# Patient Record
Sex: Female | Born: 1985 | Race: White | Hispanic: No | Marital: Married | Smoking: Current every day smoker
Health system: Southern US, Community
[De-identification: ages and names within clinical notes are randomized; demographics above are authoritative.]

## PROBLEM LIST (undated history)

## (undated) DIAGNOSIS — F329 Major depressive disorder, single episode, unspecified: Secondary | ICD-10-CM

## (undated) DIAGNOSIS — F419 Anxiety disorder, unspecified: Secondary | ICD-10-CM

## (undated) DIAGNOSIS — I2699 Other pulmonary embolism without acute cor pulmonale: Secondary | ICD-10-CM

## (undated) DIAGNOSIS — N907 Vulvar cyst: Secondary | ICD-10-CM

## (undated) DIAGNOSIS — F32A Depression, unspecified: Secondary | ICD-10-CM

## (undated) DIAGNOSIS — R58 Hemorrhage, not elsewhere classified: Secondary | ICD-10-CM

## (undated) DIAGNOSIS — K219 Gastro-esophageal reflux disease without esophagitis: Secondary | ICD-10-CM

## (undated) DIAGNOSIS — E0590 Thyrotoxicosis, unspecified without thyrotoxic crisis or storm: Secondary | ICD-10-CM

## (undated) DIAGNOSIS — J019 Acute sinusitis, unspecified: Secondary | ICD-10-CM

## (undated) DIAGNOSIS — L409 Psoriasis, unspecified: Secondary | ICD-10-CM

## (undated) DIAGNOSIS — O139 Gestational [pregnancy-induced] hypertension without significant proteinuria, unspecified trimester: Secondary | ICD-10-CM

## (undated) DIAGNOSIS — M722 Plantar fascial fibromatosis: Secondary | ICD-10-CM

## (undated) DIAGNOSIS — J40 Bronchitis, not specified as acute or chronic: Secondary | ICD-10-CM

## (undated) HISTORY — DX: Acute sinusitis, unspecified: J01.90

## (undated) HISTORY — DX: Hemorrhage, not elsewhere classified: R58

## (undated) HISTORY — DX: Major depressive disorder, single episode, unspecified: F32.9

## (undated) HISTORY — PX: APPENDECTOMY: SHX54

## (undated) HISTORY — DX: Bronchitis, not specified as acute or chronic: J40

## (undated) HISTORY — DX: Plantar fascial fibromatosis: M72.2

## (undated) HISTORY — DX: Psoriasis, unspecified: L40.9

## (undated) HISTORY — DX: Anxiety disorder, unspecified: F41.9

## (undated) HISTORY — DX: Vulvar cyst: N90.7

## (undated) HISTORY — DX: Other pulmonary embolism without acute cor pulmonale: I26.99

## (undated) HISTORY — DX: Gastro-esophageal reflux disease without esophagitis: K21.9

## (undated) HISTORY — PX: WISDOM TOOTH EXTRACTION: SHX21

## (undated) HISTORY — DX: Depression, unspecified: F32.A

## (undated) HISTORY — DX: Thyrotoxicosis, unspecified without thyrotoxic crisis or storm: E05.90

---

## 2014-08-06 DIAGNOSIS — I2699 Other pulmonary embolism without acute cor pulmonale: Secondary | ICD-10-CM

## 2014-08-06 HISTORY — DX: Other pulmonary embolism without acute cor pulmonale: I26.99

## 2014-08-06 NOTE — L&D Delivery Note (Signed)
Delivery Note At 4:24 PM a viable female was delivered via  (Presentation: OA;  ).  APGAR: 8,8, baby w/ active cry but slightly dusky and taken to radiant warmer for blow-by oxygen w/o complication ; weight pending .   Placenta status: intact, .  Cord: 3 vessels  with the following complications: none  Anesthesia:  epidural Episiotomy:  none Lacerations:  nione Est. Blood Loss (mL):  334  Mom to postpartum.  Baby to Couplet care / Skin to Skin.  Reyes Ivan 03/05/2015, 4:42 PM

## 2014-08-17 ENCOUNTER — Other Ambulatory Visit: Payer: Self-pay | Admitting: Obstetrics & Gynecology

## 2014-08-17 DIAGNOSIS — O3680X0 Pregnancy with inconclusive fetal viability, not applicable or unspecified: Secondary | ICD-10-CM

## 2014-08-18 ENCOUNTER — Encounter: Payer: Self-pay | Admitting: *Deleted

## 2014-08-19 ENCOUNTER — Ambulatory Visit (INDEPENDENT_AMBULATORY_CARE_PROVIDER_SITE_OTHER): Payer: BLUE CROSS/BLUE SHIELD

## 2014-08-19 ENCOUNTER — Other Ambulatory Visit: Payer: Self-pay | Admitting: Obstetrics & Gynecology

## 2014-08-19 ENCOUNTER — Other Ambulatory Visit: Payer: BLUE CROSS/BLUE SHIELD

## 2014-08-19 DIAGNOSIS — Z3682 Encounter for antenatal screening for nuchal translucency: Secondary | ICD-10-CM

## 2014-08-19 DIAGNOSIS — Z3481 Encounter for supervision of other normal pregnancy, first trimester: Secondary | ICD-10-CM

## 2014-08-19 DIAGNOSIS — Z36 Encounter for antenatal screening of mother: Secondary | ICD-10-CM

## 2014-08-19 DIAGNOSIS — O3680X0 Pregnancy with inconclusive fetal viability, not applicable or unspecified: Secondary | ICD-10-CM

## 2014-08-19 NOTE — Progress Notes (Signed)
U/S(11+2wks)-single IUP with +FCA noted, FHR-160 bpm, CRL c/w LMP dates, posterior Gr 0 placenta, cx appears closed, bilateral adnexa appears WNL, pt desires NT/IT screening, NT-1.54mm, unable to view NB due to fetal position

## 2014-08-20 LAB — US OB COMP LESS 14 WKS

## 2014-08-25 LAB — MATERNAL SCREEN, INTEGRATED #1

## 2014-08-30 ENCOUNTER — Encounter: Payer: Self-pay | Admitting: Women's Health

## 2014-08-30 ENCOUNTER — Ambulatory Visit (INDEPENDENT_AMBULATORY_CARE_PROVIDER_SITE_OTHER): Payer: BLUE CROSS/BLUE SHIELD | Admitting: Women's Health

## 2014-08-30 VITALS — BP 132/78 | Ht 64.0 in | Wt 238.0 lb

## 2014-08-30 DIAGNOSIS — Z3490 Encounter for supervision of normal pregnancy, unspecified, unspecified trimester: Secondary | ICD-10-CM | POA: Insufficient documentation

## 2014-08-30 DIAGNOSIS — Z114 Encounter for screening for human immunodeficiency virus [HIV]: Secondary | ICD-10-CM

## 2014-08-30 DIAGNOSIS — F418 Other specified anxiety disorders: Secondary | ICD-10-CM

## 2014-08-30 DIAGNOSIS — Z1389 Encounter for screening for other disorder: Secondary | ICD-10-CM

## 2014-08-30 DIAGNOSIS — Z0184 Encounter for antibody response examination: Secondary | ICD-10-CM

## 2014-08-30 DIAGNOSIS — O09899 Supervision of other high risk pregnancies, unspecified trimester: Secondary | ICD-10-CM | POA: Insufficient documentation

## 2014-08-30 DIAGNOSIS — Z1371 Encounter for nonprocreative screening for genetic disease carrier status: Secondary | ICD-10-CM

## 2014-08-30 DIAGNOSIS — Z331 Pregnant state, incidental: Secondary | ICD-10-CM

## 2014-08-30 DIAGNOSIS — Z113 Encounter for screening for infections with a predominantly sexual mode of transmission: Secondary | ICD-10-CM

## 2014-08-30 DIAGNOSIS — Z0283 Encounter for blood-alcohol and blood-drug test: Secondary | ICD-10-CM

## 2014-08-30 DIAGNOSIS — Z3481 Encounter for supervision of other normal pregnancy, first trimester: Secondary | ICD-10-CM

## 2014-08-30 DIAGNOSIS — O09891 Supervision of other high risk pregnancies, first trimester: Secondary | ICD-10-CM

## 2014-08-30 DIAGNOSIS — Z118 Encounter for screening for other infectious and parasitic diseases: Secondary | ICD-10-CM

## 2014-08-30 DIAGNOSIS — Z1159 Encounter for screening for other viral diseases: Secondary | ICD-10-CM

## 2014-08-30 DIAGNOSIS — Z3491 Encounter for supervision of normal pregnancy, unspecified, first trimester: Secondary | ICD-10-CM

## 2014-08-30 LAB — POCT URINALYSIS DIPSTICK
Blood, UA: NEGATIVE
Glucose, UA: NEGATIVE
Ketones, UA: NEGATIVE
Leukocytes, UA: NEGATIVE
Nitrite, UA: NEGATIVE
PROTEIN UA: NEGATIVE

## 2014-08-30 LAB — RPR

## 2014-08-30 LAB — HIV ANTIBODY (ROUTINE TESTING W REFLEX): HIV 1&2 Ab, 4th Generation: NONREACTIVE

## 2014-08-30 LAB — CBC
HCT: 40.3 % (ref 36.0–46.0)
Hemoglobin: 13.7 g/dL (ref 12.0–15.0)
MCH: 28.8 pg (ref 26.0–34.0)
MCHC: 34 g/dL (ref 30.0–36.0)
MCV: 84.7 fL (ref 78.0–100.0)
MPV: 9.2 fL (ref 8.6–12.4)
Platelets: 254 10*3/uL (ref 150–400)
RBC: 4.76 MIL/uL (ref 3.87–5.11)
RDW: 13.4 % (ref 11.5–15.5)
WBC: 6.1 10*3/uL (ref 4.0–10.5)

## 2014-08-30 LAB — HEPATITIS B SURFACE ANTIGEN: HEP B S AG: NEGATIVE

## 2014-08-30 MED ORDER — DOXYLAMINE-PYRIDOXINE 10-10 MG PO TBEC: DELAYED_RELEASE_TABLET | ORAL | Status: DC

## 2014-08-30 MED ORDER — CONCEPT DHA 53.5-38-1 MG PO CAPS: 1.0000 | ORAL_CAPSULE | Freq: Every day | ORAL | Status: DC

## 2014-08-30 MED ORDER — CITALOPRAM HYDROBROMIDE 40 MG PO TABS: 40.0000 mg | ORAL_TABLET | Freq: Every day | ORAL | Status: DC

## 2014-08-30 NOTE — Progress Notes (Addendum)
Subjective:  Cheryl Bowman is a 29 y.o. G69P2002 Caucasian female at [redacted]w[redacted]d by LMP c/w 11wk u/s, being seen today for her first obstetrical visit.  Her obstetrical history is significant for term SVB x 2, last birth 12/06/13- did have labile bp's but no dx of [facility] both other children at [facility]. H/O depression/anxiety dx at 29yo, has been on multiple meds, latest paxil, but was taken off in Dec by [facility], sees them q 6 months for meds, has never had counseling. Celexa 40mg  worked very well for her last pregnancy. Pregnancy history fully reviewed.  Patient reports n/v, was rx'd diclegis by PCP, has only been taking 2 q hs- states isn't helping- instructed that she can go up to 4/day- if still not helping to let us know. Denies vb, cramping, uti s/s, abnormal/malodorous vag d/c, or vulvovaginal itching/irritation.  BP 144/86 mmHg  Wt 238 lb (107.956 kg)  LMP 06/01/2014  BP recheck 132/78, no h/o HTN other than labile bp's at end of last pregnancy  HISTORY: OB History  Gravida Para Term Preterm AB SAB TAB Ectopic Multiple Living  3 2 2       2     # Outcome Date GA Lbr Len/2nd Weight Sex Delivery Anes PTL Lv  3 Current           2 Term 12/06/13 [redacted]w[redacted]d  6 lb 4 oz (2.835 kg) M Vag-Spont EPI N Y  1 Term 05/24/06 [redacted]w[redacted]d  8 lb 4 oz (3.742 kg) F Vag-Spont EPI N Y     Past Medical History  Diagnosis Date  . Depression   . Anxiety   . Bronchitis   . Plantar fasciitis   . Sinusitis, acute    Past Surgical History  Procedure Laterality Date  . Appendectomy     Family History  Problem Relation Age of Onset  . Alcohol abuse Mother   . Hypertension Father   . Depression Father   . Alcohol abuse Father   . Hyperlipidemia Father   . Stroke Father   . Cancer Maternal Grandmother     lung  . Depression Paternal Grandmother   . Kidney disease Paternal Grandfather   . Heart disease Paternal Grandfather     Exam   System:     General: Well developed & nourished, no acute distress   Skin:  Warm & dry, normal coloration and turgor, no rashes   Neurologic: Alert & oriented, normal mood   Cardiovascular: Regular rate & rhythm   Respiratory: Effort & rate normal, LCTAB, acyanotic   Abdomen: Soft, non tender   Extremities: normal strength, tone   Thin prep pap smear neg 2015 [place] per pt  FHR: 160s via informal u/s, was unable to hear w/ doppler   Assessment:   Pregnancy: F7P1025 Patient Active Problem List   Diagnosis Date Noted  . Supervision of normal pregnancy 08/30/2014    Priority: High    [redacted]w[redacted]d G3P2002 New OB visit N/V of pregnancy Depression/anxiety Short interval pregnancy  Plan:  Initial labs drawn Continue prenatal vitamins Problem list reviewed and updated Reviewed n/v relief measures and warning s/s to report Rx diclegis, 2 samples given, coupon card given Rx celexa 20mg  x 1wk then increase to 40mg  daily Referral to [facility] faxed to begin counseling Reviewed recommended weight gain based on pre-gravid BMI Encouraged well-balanced diet Genetic Screening discussed Integrated Screen: 1st it/nt already done last week- was unable to see nasal bone d/t position- discussed w/ Cheryl Bowman- does not need to be repeated  Cystic fibrosis screening discussed requested Ultrasound discussed; fetal survey: requested Follow up in 3 weeks for 2nd IT and visit [place] completed Get pap records from [place], [facility] 08/30/2014 11:13 AM

## 2014-08-30 NOTE — Patient Instructions (Signed)
Nausea & Vomiting  Have saltine crackers or pretzels by your bed and eat a few bites before you raise your head out of bed in the morning  Eat small frequent meals throughout the day instead of large meals  Drink plenty of fluids throughout the day to stay hydrated, just don't drink a lot of fluids with your meals.  This can make your stomach fill up faster making you feel sick  Do not brush your teeth right after you eat  Products with real ginger are good for nausea, like ginger ale and ginger hard candy Make sure it says made with real ginger!  Sucking on sour candy like lemon heads is also good for nausea  If your prenatal vitamins make you nauseated, take them at night so you will sleep through the nausea  Sea Bands  If you feel like you need medicine for the nausea & vomiting please let us know  If you are unable to keep any fluids or food down please let us know   First Trimester of Pregnancy The first trimester of pregnancy is from week 1 until the end of week 12 (months 1 through 3). A week after a sperm fertilizes an egg, the egg will implant on the wall of the uterus. This embryo will begin to develop into a baby. Genes from you and your partner are forming the baby. The female genes determine whether the baby is a boy or a girl. At 6-8 weeks, the eyes and face are formed, and the heartbeat can be seen on ultrasound. At the end of 12 weeks, all the baby's organs are formed.  Now that you are pregnant, you will want to do everything you can to have a healthy baby. Two of the most important things are to get good prenatal care and to follow your health care provider's instructions. Prenatal care is all the medical care you receive before the baby's birth. This care will help prevent, find, and treat any problems during the pregnancy and childbirth. BODY CHANGES Your body goes through many changes during pregnancy. The changes vary from woman to woman.   You may gain or lose a  couple of pounds at first.  You may feel sick to your stomach (nauseous) and throw up (vomit). If the vomiting is uncontrollable, call your health care provider.  You may tire easily.  You may develop headaches that can be relieved by medicines approved by your health care provider.  You may urinate more often. Painful urination may mean you have a bladder infection.  You may develop heartburn as a result of your pregnancy.  You may develop constipation because certain hormones are causing the muscles that push waste through your intestines to slow down.  You may develop hemorrhoids or swollen, bulging veins (varicose veins).  Your breasts may begin to grow larger and become tender. Your nipples may stick out more, and the tissue that surrounds them (areola) may become darker.  Your gums may bleed and may be sensitive to brushing and flossing.  Dark spots or blotches (chloasma, mask of pregnancy) may develop on your face. This will likely fade after the baby is born.  Your menstrual periods will stop.  You may have a loss of appetite.  You may develop cravings for certain kinds of food.  You may have changes in your emotions from day to day, such as being excited to be pregnant or being concerned that something may go wrong with the pregnancy and baby.  You may have more vivid and strange dreams.  You may have changes in your hair. These can include thickening of your hair, rapid growth, and changes in texture. Some women also have hair loss during or after pregnancy, or hair that feels dry or thin. Your hair will most likely return to normal after your baby is born. WHAT TO EXPECT AT YOUR PRENATAL VISITS During a routine prenatal visit:  You will be weighed to make sure you and the baby are growing normally.  Your blood pressure will be taken.  Your abdomen will be measured to track your baby's growth.  The fetal heartbeat will be listened to starting around week 10 or 12  of your pregnancy.  Test results from any previous visits will be discussed. Your health care provider may ask you:  How you are feeling.  If you are feeling the baby move.  If you have had any abnormal symptoms, such as leaking fluid, bleeding, severe headaches, or abdominal cramping.  If you have any questions. Other tests that may be performed during your first trimester include:  Blood tests to find your blood type and to check for the presence of any previous infections. They will also be used to check for low iron levels (anemia) and Rh antibodies. Later in the pregnancy, blood tests for diabetes will be done along with other tests if problems develop.  Urine tests to check for infections, diabetes, or protein in the urine.  An ultrasound to confirm the proper growth and development of the baby.  An amniocentesis to check for possible genetic problems.  Fetal screens for spina bifida and Down syndrome.  You may need other tests to make sure you and the baby are doing well. HOME CARE INSTRUCTIONS  Medicines  Follow your health care provider's instructions regarding medicine use. Specific medicines may be either safe or unsafe to take during pregnancy.  Take your prenatal vitamins as directed.  If you develop constipation, try taking a stool softener if your health care provider approves. Diet  Eat regular, well-balanced meals. Choose a variety of foods, such as meat or vegetable-based protein, fish, milk and low-fat dairy products, vegetables, fruits, and whole grain breads and cereals. Your health care provider will help you determine the amount of weight gain that is right for you.  Avoid raw meat and uncooked cheese. These carry germs that can cause birth defects in the baby.  Eating four or five small meals rather than three large meals a day may help relieve nausea and vomiting. If you start to feel nauseous, eating a few soda crackers can be helpful. Drinking liquids  between meals instead of during meals also seems to help nausea and vomiting.  If you develop constipation, eat more high-fiber foods, such as fresh vegetables or fruit and whole grains. Drink enough fluids to keep your urine clear or pale yellow. Activity and Exercise  Exercise only as directed by your health care provider. Exercising will help you:  Control your weight.  Stay in shape.  Be prepared for labor and delivery.  Experiencing pain or cramping in the lower abdomen or low back is a good sign that you should stop exercising. Check with your health care provider before continuing normal exercises.  Try to avoid standing for long periods of time. Move your legs often if you must stand in one place for a long time.  Avoid heavy lifting.  Wear low-heeled shoes, and practice good posture.  You may continue to have  sex unless your health care provider directs you otherwise. Relief of Pain or Discomfort  Wear a good support bra for breast tenderness.   Take warm sitz baths to soothe any pain or discomfort caused by hemorrhoids. Use hemorrhoid cream if your health care provider approves.   Rest with your legs elevated if you have leg cramps or low back pain.  If you develop varicose veins in your legs, wear support hose. Elevate your feet for 15 minutes, 3-4 times a day. Limit salt in your diet. Prenatal Care  Schedule your prenatal visits by the twelfth week of pregnancy. They are usually scheduled monthly at first, then more often in the last 2 months before delivery.  Write down your questions. Take them to your prenatal visits.  Keep all your prenatal visits as directed by your health care provider. Safety  Wear your seat belt at all times when driving.  Make a list of emergency phone numbers, including numbers for family, friends, the hospital, and police and fire departments. General Tips  Ask your health care provider for a referral to a local prenatal education  class. Begin classes no later than at the beginning of month 6 of your pregnancy.  Ask for help if you have counseling or nutritional needs during pregnancy. Your health care provider can offer advice or refer you to specialists for help with various needs.  Do not use hot tubs, steam rooms, or saunas.  Do not douche or use tampons or scented sanitary pads.  Do not cross your legs for long periods of time.  Avoid cat litter boxes and soil used by cats. These carry germs that can cause birth defects in the baby and possibly loss of the fetus by miscarriage or stillbirth.  Avoid all smoking, herbs, alcohol, and medicines not prescribed by your health care provider. Chemicals in these affect the formation and growth of the baby.  Schedule a dentist appointment. At home, brush your teeth with a soft toothbrush and be gentle when you floss. SEEK MEDICAL CARE IF:   You have dizziness.  You have mild pelvic cramps, pelvic pressure, or nagging pain in the abdominal area.  You have persistent nausea, vomiting, or diarrhea.  You have a bad smelling vaginal discharge.  You have pain with urination.  You notice increased swelling in your face, hands, legs, or ankles. SEEK IMMEDIATE MEDICAL CARE IF:   You have a fever.  You are leaking fluid from your vagina.  You have spotting or bleeding from your vagina.  You have severe abdominal cramping or pain.  You have rapid weight gain or loss.  You vomit blood or material that looks like coffee grounds.  You are exposed to [place] measles and have never had them.  You are exposed to fifth disease or chickenpox.  You develop a severe headache.  You have shortness of breath.  You have any kind of trauma, such as from a fall or a car accident. Document Released: 07/17/2001 Document Revised: 12/07/2013 Document Reviewed: 06/02/2013 [facility] Patient Information 2015 [place], [place]. This information is not intended to replace advice given  to you by your health care provider. Make sure you discuss any questions you have with your health care provider.

## 2014-08-31 ENCOUNTER — Encounter: Payer: Self-pay | Admitting: Women's Health

## 2014-08-31 DIAGNOSIS — Z6791 Unspecified blood type, Rh negative: Secondary | ICD-10-CM | POA: Insufficient documentation

## 2014-08-31 DIAGNOSIS — O26899 Other specified pregnancy related conditions, unspecified trimester: Secondary | ICD-10-CM | POA: Insufficient documentation

## 2014-08-31 LAB — DRUG SCREEN, URINE, NO CONFIRMATION
Amphetamine Screen, Ur: NEGATIVE
BENZODIAZEPINES.: NEGATIVE
Barbiturate Quant, Ur: NEGATIVE
CREATININE, U: 93.7 mg/dL
Cocaine Metabolites: NEGATIVE
METHADONE: NEGATIVE
Marijuana Metabolite: NEGATIVE
Opiate Screen, Urine: NEGATIVE
Phencyclidine (PCP): NEGATIVE
Propoxyphene: NEGATIVE

## 2014-08-31 LAB — GC/CHLAMYDIA PROBE AMP
CT Probe RNA: NEGATIVE
GC PROBE AMP APTIMA: NEGATIVE

## 2014-08-31 LAB — OXYCODONE SCREEN, UA, RFLX CONFIRM: Oxycodone Screen, Ur: NEGATIVE ng/mL

## 2014-08-31 LAB — URINALYSIS, ROUTINE W REFLEX MICROSCOPIC
BILIRUBIN URINE: NEGATIVE
Glucose, UA: NEGATIVE mg/dL
HGB URINE DIPSTICK: NEGATIVE
Ketones, ur: NEGATIVE mg/dL
Leukocytes, UA: NEGATIVE
NITRITE: NEGATIVE
PH: 7 (ref 5.0–8.0)
PROTEIN: NEGATIVE mg/dL
Specific Gravity, Urine: 1.018 (ref 1.005–1.030)
Urobilinogen, UA: 0.2 mg/dL (ref 0.0–1.0)

## 2014-08-31 LAB — RUBELLA SCREEN: Rubella: 3.45 Index — ABNORMAL HIGH (ref <0.90)

## 2014-08-31 LAB — ANTIBODY SCREEN: Antibody Screen: NEGATIVE

## 2014-08-31 LAB — URINE CULTURE
COLONY COUNT: NO GROWTH
Organism ID, Bacteria: NO GROWTH

## 2014-08-31 LAB — ABO AND RH: RH TYPE: NEGATIVE

## 2014-08-31 LAB — VARICELLA ZOSTER ANTIBODY, IGG: Varicella IgG: 3621 Index — ABNORMAL HIGH (ref <135.00)

## 2014-09-01 LAB — CYSTIC FIBROSIS DIAGNOSTIC STUDY

## 2014-09-20 ENCOUNTER — Encounter: Payer: BLUE CROSS/BLUE SHIELD | Admitting: Women's Health

## 2014-09-23 ENCOUNTER — Ambulatory Visit (INDEPENDENT_AMBULATORY_CARE_PROVIDER_SITE_OTHER): Payer: BLUE CROSS/BLUE SHIELD | Admitting: Adult Health

## 2014-09-23 ENCOUNTER — Encounter: Payer: Self-pay | Admitting: Adult Health

## 2014-09-23 VITALS — BP 130/70 | Wt 237.5 lb

## 2014-09-23 DIAGNOSIS — Z3481 Encounter for supervision of other normal pregnancy, first trimester: Secondary | ICD-10-CM | POA: Diagnosis not present

## 2014-09-23 DIAGNOSIS — Z331 Pregnant state, incidental: Secondary | ICD-10-CM

## 2014-09-23 DIAGNOSIS — Z3492 Encounter for supervision of normal pregnancy, unspecified, second trimester: Secondary | ICD-10-CM

## 2014-09-23 DIAGNOSIS — Z369 Encounter for antenatal screening, unspecified: Secondary | ICD-10-CM

## 2014-09-23 DIAGNOSIS — Z3482 Encounter for supervision of other normal pregnancy, second trimester: Secondary | ICD-10-CM

## 2014-09-23 DIAGNOSIS — Z1389 Encounter for screening for other disorder: Secondary | ICD-10-CM

## 2014-09-23 LAB — POCT URINALYSIS DIPSTICK
Glucose, UA: NEGATIVE
Ketones, UA: NEGATIVE
NITRITE UA: NEGATIVE
Protein, UA: NEGATIVE

## 2014-09-23 NOTE — Progress Notes (Signed)
[id number] [redacted]w[redacted]d Estimated Date of Delivery: 03/08/15  Blood pressure 130/70, weight 237 lb 8 oz (107.729 kg), last menstrual period 06/01/2014, not currently breastfeeding.   BP weight and urine results all reviewed and noted. Trace blood and 1+ WBC in urine Please refer to the obstetrical flow sheet for the fundal height and fetal heart rate documentation:FHR 143 by doppler  Patient denies any bleeding and no rupture of membranes symptoms or regular contractions. Has headache today taking tylenol, try caffeine drink, and Claritin, no vision changes.If not better in am call. All questions were answered. For second IT today.  Plan:  Continued routine obstetrical care,   Follow up in 4 weeks for OB appointment, and anatomy scan

## 2014-09-23 NOTE — Patient Instructions (Signed)
Second Trimester of Pregnancy The second trimester is from week 13 through week 28, months 4 through 6. The second trimester is often a time when you feel your best. Your body has also adjusted to being pregnant, and you begin to feel better physically. Usually, morning sickness has lessened or quit completely, you may have more energy, and you may have an increase in appetite. The second trimester is also a time when the fetus is growing rapidly. At the end of the sixth month, the fetus is about 9 inches long and weighs about 1 pounds. You will likely begin to feel the baby move (quickening) between 18 and 20 weeks of the pregnancy. BODY CHANGES Your body goes through many changes during pregnancy. The changes vary from woman to woman.   Your weight will continue to increase. You will notice your lower abdomen bulging out.  You may begin to get stretch marks on your hips, abdomen, and breasts.  You may develop headaches that can be relieved by medicines approved by your health care provider.  You may urinate more often because the fetus is pressing on your bladder.  You may develop or continue to have heartburn as a result of your pregnancy.  You may develop constipation because certain hormones are causing the muscles that push waste through your intestines to slow down.  You may develop hemorrhoids or swollen, bulging veins (varicose veins).  You may have back pain because of the weight gain and pregnancy hormones relaxing your joints between the bones in your pelvis and as a result of a shift in weight and the muscles that support your balance.  Your breasts will continue to grow and be tender.  Your gums may bleed and may be sensitive to brushing and flossing.  Dark spots or blotches (chloasma, mask of pregnancy) may develop on your face. This will likely fade after the baby is born.  A dark line from your belly button to the pubic area (linea nigra) may appear. This will likely fade  after the baby is born.  You may have changes in your hair. These can include thickening of your hair, rapid growth, and changes in texture. Some women also have hair loss during or after pregnancy, or hair that feels dry or thin. Your hair will most likely return to normal after your baby is born. WHAT TO EXPECT AT YOUR PRENATAL VISITS During a routine prenatal visit:  You will be weighed to make sure you and the fetus are growing normally.  Your blood pressure will be taken.  Your abdomen will be measured to track your baby's growth.  The fetal heartbeat will be listened to.  Any test results from the previous visit will be discussed. Your health care provider may ask you:  How you are feeling.  If you are feeling the baby move.  If you have had any abnormal symptoms, such as leaking fluid, bleeding, severe headaches, or abdominal cramping.  If you have any questions. Other tests that may be performed during your second trimester include:  Blood tests that check for:  Low iron levels (anemia).  Gestational diabetes (between 24 and 28 weeks).  Rh antibodies.  Urine tests to check for infections, diabetes, or protein in the urine.  An ultrasound to confirm the proper growth and development of the baby.  An amniocentesis to check for possible genetic problems.  Fetal screens for spina bifida and Down syndrome. HOME CARE INSTRUCTIONS   Avoid all smoking, herbs, alcohol, and unprescribed   drugs. These chemicals affect the formation and growth of the baby.  Follow your health care provider's instructions regarding medicine use. There are medicines that are either safe or unsafe to take during pregnancy.  Exercise only as directed by your health care provider. Experiencing uterine cramps is a good sign to stop exercising.  Continue to eat regular, healthy meals.  Wear a good support bra for breast tenderness.  Do not use hot tubs, steam rooms, or saunas.  Wear your  seat belt at all times when driving.  Avoid raw meat, uncooked cheese, cat litter boxes, and soil used by cats. These carry germs that can cause birth defects in the baby.  Take your prenatal vitamins.  Try taking a stool softener (if your health care provider approves) if you develop constipation. Eat more high-fiber foods, such as fresh vegetables or fruit and whole grains. Drink plenty of fluids to keep your urine clear or pale yellow.  Take warm sitz baths to soothe any pain or discomfort caused by hemorrhoids. Use hemorrhoid cream if your health care provider approves.  If you develop varicose veins, wear support hose. Elevate your feet for 15 minutes, 3-4 times a day. Limit salt in your diet.  Avoid heavy lifting, wear low heel shoes, and practice good posture.  Rest with your legs elevated if you have leg cramps or low back pain.  Visit your dentist if you have not gone yet during your pregnancy. Use a soft toothbrush to brush your teeth and be gentle when you floss.  A sexual relationship may be continued unless your health care provider directs you otherwise.  Continue to go to all your prenatal visits as directed by your health care provider. SEEK MEDICAL CARE IF:   You have dizziness.  You have mild pelvic cramps, pelvic pressure, or nagging pain in the abdominal area.  You have persistent nausea, vomiting, or diarrhea.  You have a bad smelling vaginal discharge.  You have pain with urination. SEEK IMMEDIATE MEDICAL CARE IF:   You have a fever.  You are leaking fluid from your vagina.  You have spotting or bleeding from your vagina.  You have severe abdominal cramping or pain.  You have rapid weight gain or loss.  You have shortness of breath with chest pain.  You notice sudden or extreme swelling of your face, hands, ankles, feet, or legs.  You have not felt your baby move in over an hour.  You have severe headaches that do not go away with  medicine.  You have vision changes. Document Released: 07/17/2001 Document Revised: 07/28/2013 Document Reviewed: 09/23/2012 [facility] Patient Information 2015 [place], [place]. This information is not intended to replace advice given to you by your health care provider. Make sure you discuss any questions you have with your health care provider. Return in 4 weeks for [place]

## 2014-09-28 LAB — MATERNAL SCREEN, INTEGRATED #2
AFP MoM: 0.85
AFP, Serum: 22.9 ng/mL
CROWN RUMP LENGTH MAT SCREEN 2: 53.4 mm
Calculated Gestational Age: 16.7
ESTRIOL FREE MAT SCREEN: 0.7 ng/mL
ESTRIOL MOM MAT SCREEN: 0.83
HCG, SERUM MAT SCREEN: 40 [IU]/mL
Inhibin A Dimeric: 185 pg/mL
Inhibin A MoM: 1.37
MSS Down Syndrome: <1:5000 {titer}
MSS Trisomy 18 Risk: <1:5000 {titer}
NT MoM: 1.09
NUCHAL TRANSLUCENCY MAT SCREEN 2: 1.4 mm
Number of fetuses: 1
PAPP-A MOM MAT SCREEN: 1.41
PAPP-A: 423 ng/mL
hCG MoM: 1.7

## 2014-10-21 ENCOUNTER — Other Ambulatory Visit: Payer: Self-pay | Admitting: Adult Health

## 2014-10-21 ENCOUNTER — Ambulatory Visit (INDEPENDENT_AMBULATORY_CARE_PROVIDER_SITE_OTHER): Payer: BLUE CROSS/BLUE SHIELD | Admitting: Advanced Practice Midwife

## 2014-10-21 ENCOUNTER — Ambulatory Visit (INDEPENDENT_AMBULATORY_CARE_PROVIDER_SITE_OTHER): Payer: BLUE CROSS/BLUE SHIELD

## 2014-10-21 VITALS — BP 120/80 | HR 84 | Wt 236.0 lb

## 2014-10-21 DIAGNOSIS — Z3492 Encounter for supervision of normal pregnancy, unspecified, second trimester: Secondary | ICD-10-CM

## 2014-10-21 DIAGNOSIS — Z331 Pregnant state, incidental: Secondary | ICD-10-CM

## 2014-10-21 DIAGNOSIS — N898 Other specified noninflammatory disorders of vagina: Secondary | ICD-10-CM | POA: Diagnosis not present

## 2014-10-21 DIAGNOSIS — Z1389 Encounter for screening for other disorder: Secondary | ICD-10-CM

## 2014-10-21 DIAGNOSIS — Z3482 Encounter for supervision of other normal pregnancy, second trimester: Secondary | ICD-10-CM | POA: Diagnosis not present

## 2014-10-21 LAB — POCT URINALYSIS DIPSTICK
Blood, UA: NEGATIVE
Glucose, UA: NEGATIVE
Ketones, UA: NEGATIVE
LEUKOCYTES UA: NEGATIVE
Nitrite, UA: NEGATIVE
PROTEIN UA: NEGATIVE

## 2014-10-21 MED ORDER — TERCONAZOLE 0.4 % VA CREA: TOPICAL_CREAM | VAGINAL | Status: DC

## 2014-10-21 NOTE — Progress Notes (Signed)
[id number] [redacted]w[redacted]d Estimated Date of Delivery: 03/08/15  Last menstrual period 06/01/2014, not currently breastfeeding.   BP weight and urine results all reviewed and noted.  Please refer to the obstetrical flow sheet for the fundal height and fetal heart rate documentation: Had anatomy scan today:  [place] 20WKS2D,SINGLE FETUS,BREECH,MEASUREMENTS C/W DATES, FLUID WNL,POST PL GRADE 0,CX APPEARS TO BE CLOSED ,BILAT ADNEX WNL,FHR 152BPM,NO MAJOR ABN NOTED,ANATOMY SCREEN COMPLETED  Patient reports good fetal movement, denies any bleeding and no rupture of membranes symptoms or regular contractions. Patient c/o external vaginal itching without discharge for 3 months (has not mentioned it to anyone).Vulva red with yeast rash.  SSE:  Normal appearing dc, wet prep neg All questions were answered.  Plan:  Continued routine obstetrical care, monistat to vulva  Follow up in 4 weeks for OB appointment,

## 2014-10-21 NOTE — Progress Notes (Signed)
[place] 20WKS2D,SINGLE FETUS,BREECH,MEASUREMENTS C/W DATES, FLUID WNL,POST PL GRADE 0,CX APPEARS TO BE CLOSED ,BILAT ADNEXA  WNL,FHR 152BPM,NO MAJOR ABN NOTED,ANATOMY SCREEN COMPLETED

## 2014-11-18 ENCOUNTER — Ambulatory Visit (INDEPENDENT_AMBULATORY_CARE_PROVIDER_SITE_OTHER): Payer: BLUE CROSS/BLUE SHIELD | Admitting: Obstetrics & Gynecology

## 2014-11-18 VITALS — BP 120/80 | HR 80 | Wt 237.0 lb

## 2014-11-18 DIAGNOSIS — Z331 Pregnant state, incidental: Secondary | ICD-10-CM

## 2014-11-18 DIAGNOSIS — Z1389 Encounter for screening for other disorder: Secondary | ICD-10-CM

## 2014-11-18 DIAGNOSIS — Z3492 Encounter for supervision of normal pregnancy, unspecified, second trimester: Secondary | ICD-10-CM

## 2014-11-18 LAB — POCT URINALYSIS DIPSTICK
Blood, UA: NEGATIVE
Glucose, UA: NEGATIVE
Ketones, UA: NEGATIVE
LEUKOCYTES UA: NEGATIVE
NITRITE UA: NEGATIVE

## 2014-11-18 MED ORDER — LANSOPRAZOLE 15 MG PO TBDP: 15.0000 mg | ORAL_TABLET | Freq: Every day | ORAL | Status: DC

## 2014-11-18 NOTE — Progress Notes (Signed)
Pt denies any problems or concerns at this time.  

## 2014-11-18 NOTE — Progress Notes (Signed)
[id number] [redacted]w[redacted]d Estimated Date of Delivery: 03/08/15  Blood pressure 120/80, pulse 80, weight 237 lb (107.502 kg), last menstrual period 06/01/2014, not currently breastfeeding.   BP weight and urine results all reviewed and noted.  Please refer to the obstetrical flow sheet for the fundal height and fetal heart rate documentation:  Patient reports good fetal movement, denies any bleeding and no rupture of membranes symptoms or regular contractions. Patient is without complaints. All questions were answered.  Plan:  Continued routine obstetrical care, **  Follow up in 4 weeks for OB appointment, PN2

## 2014-12-16 ENCOUNTER — Encounter: Payer: Self-pay | Admitting: Advanced Practice Midwife

## 2014-12-16 ENCOUNTER — Other Ambulatory Visit: Payer: BLUE CROSS/BLUE SHIELD

## 2014-12-16 ENCOUNTER — Ambulatory Visit (INDEPENDENT_AMBULATORY_CARE_PROVIDER_SITE_OTHER): Payer: BLUE CROSS/BLUE SHIELD | Admitting: Advanced Practice Midwife

## 2014-12-16 VITALS — BP 116/78 | HR 68 | Wt 241.0 lb

## 2014-12-16 DIAGNOSIS — Z369 Encounter for antenatal screening, unspecified: Secondary | ICD-10-CM

## 2014-12-16 DIAGNOSIS — Z3493 Encounter for supervision of normal pregnancy, unspecified, third trimester: Secondary | ICD-10-CM

## 2014-12-16 DIAGNOSIS — F418 Other specified anxiety disorders: Secondary | ICD-10-CM

## 2014-12-16 DIAGNOSIS — Z1389 Encounter for screening for other disorder: Secondary | ICD-10-CM

## 2014-12-16 DIAGNOSIS — Z131 Encounter for screening for diabetes mellitus: Secondary | ICD-10-CM

## 2014-12-16 DIAGNOSIS — Z331 Pregnant state, incidental: Secondary | ICD-10-CM

## 2014-12-16 LAB — POCT URINALYSIS DIPSTICK
Blood, UA: NEGATIVE
Glucose, UA: NEGATIVE
KETONES UA: NEGATIVE
Leukocytes, UA: NEGATIVE
Nitrite, UA: NEGATIVE

## 2014-12-16 NOTE — Progress Notes (Signed)
[id number] [redacted]w[redacted]d Estimated Date of Delivery: 03/08/15  Blood pressure 116/78, pulse 68, weight 241 lb (109.317 kg), last menstrual period 06/01/2014, not currently breastfeeding.   BP weight and urine results all reviewed and noted.  Please refer to the obstetrical flow sheet for the fundal height and fetal heart rate documentation:  Patient reports good fetal movement, denies any bleeding and no rupture of membranes symptoms or regular contractions. Patient states that her celexa never worked and wants to change meds.  However, she states that zoloft, lexapro, prozac "never worked." "no meds have ever worked".   States that main complaint is "being irritable all the time".  Denies SI/HI.  Adamantly refuses any type of counseling.  Pt got irritated with me when I expressed that therapy is a valuable tool for dealing with psychiatric issues.  She stated that "how's therapy going to help when it's a chemical imbalance" and "y'all ([facility] providers) are the only ones who have ever even recommended therapy.  I ust want a pill to make it better."   All questions were answered.  Plan:  Continued routine obstetrical care, PN2 today.  Continue celexa:  Pt acknowledges that she has never responded to any of the meds that are safer for pregnancy.  I still encouraged counseling  Follow up in 3 weeks for OB appointment,

## 2014-12-17 LAB — CBC
HEMATOCRIT: 35.7 % (ref 34.0–46.6)
HEMOGLOBIN: 11.7 g/dL (ref 11.1–15.9)
MCH: 28.8 pg (ref 26.6–33.0)
MCHC: 32.8 g/dL (ref 31.5–35.7)
MCV: 88 fL (ref 79–97)
PLATELETS: 231 10*3/uL (ref 150–379)
RBC: 4.06 x10E6/uL (ref 3.77–5.28)
RDW: 14.2 % (ref 12.3–15.4)
WBC: 6.7 10*3/uL (ref 3.4–10.8)

## 2014-12-17 LAB — GLUCOSE TOLERANCE, 2 HOURS W/ 1HR
GLUCOSE, FASTING: 84 mg/dL (ref 65–91)
Glucose, 1 hour: 164 mg/dL (ref 65–179)
Glucose, 2 hour: 122 mg/dL (ref 65–152)

## 2014-12-17 LAB — RPR: RPR Ser Ql: NONREACTIVE

## 2014-12-17 LAB — HIV ANTIBODY (ROUTINE TESTING W REFLEX): HIV Screen 4th Generation wRfx: NONREACTIVE

## 2014-12-17 LAB — ANTIBODY SCREEN: Antibody Screen: NEGATIVE

## 2014-12-17 LAB — HSV 2 ANTIBODY, IGG

## 2015-01-05 ENCOUNTER — Ambulatory Visit (INDEPENDENT_AMBULATORY_CARE_PROVIDER_SITE_OTHER): Payer: BLUE CROSS/BLUE SHIELD | Admitting: Women's Health

## 2015-01-05 ENCOUNTER — Encounter: Payer: Self-pay | Admitting: Women's Health

## 2015-01-05 VITALS — BP 136/84 | HR 92 | Wt 240.0 lb

## 2015-01-05 DIAGNOSIS — O360131 Maternal care for anti-D [Rh] antibodies, third trimester, fetus 1: Secondary | ICD-10-CM

## 2015-01-05 DIAGNOSIS — Z331 Pregnant state, incidental: Secondary | ICD-10-CM

## 2015-01-05 DIAGNOSIS — Z3493 Encounter for supervision of normal pregnancy, unspecified, third trimester: Secondary | ICD-10-CM

## 2015-01-05 DIAGNOSIS — Z6791 Unspecified blood type, Rh negative: Secondary | ICD-10-CM

## 2015-01-05 DIAGNOSIS — Z1389 Encounter for screening for other disorder: Secondary | ICD-10-CM

## 2015-01-05 LAB — POCT URINALYSIS DIPSTICK
Blood, UA: NEGATIVE
Glucose, UA: NEGATIVE
Ketones, UA: NEGATIVE
Nitrite, UA: NEGATIVE
Protein, UA: NEGATIVE

## 2015-01-05 MED ORDER — RHO D IMMUNE GLOBULIN 1500 UNIT/2ML IJ SOSY
300.0000 ug | PREFILLED_SYRINGE | Freq: Once | INTRAMUSCULAR | Status: AC
  Administered 2015-01-05: 300 ug via INTRAMUSCULAR

## 2015-01-05 NOTE — Patient Instructions (Signed)

## 2015-01-05 NOTE — Progress Notes (Signed)
Low-risk OB appointment [id number] [redacted]w[redacted]d Estimated Date of Delivery: 03/08/15 BP 136/84 mmHg  Pulse 92  Wt 240 lb (108.863 kg)  LMP 06/01/2014  BP, weight, and urine reviewed.  Refer to obstetrical flow sheet for FH & FHR.  Reports good fm.  Denies regular uc's, lof, vb, or uti s/s. No complaints. Reviewed ptl s/s, fkc, normal pn2 results. Plan:  Continue routine obstetrical care  F/U in 2wks for OB appointment  Rhogam today

## 2015-01-20 ENCOUNTER — Ambulatory Visit (INDEPENDENT_AMBULATORY_CARE_PROVIDER_SITE_OTHER): Payer: BLUE CROSS/BLUE SHIELD | Admitting: Advanced Practice Midwife

## 2015-01-20 VITALS — BP 122/70 | HR 97 | Wt 242.0 lb

## 2015-01-20 DIAGNOSIS — Z3493 Encounter for supervision of normal pregnancy, unspecified, third trimester: Secondary | ICD-10-CM

## 2015-01-20 DIAGNOSIS — Z1389 Encounter for screening for other disorder: Secondary | ICD-10-CM

## 2015-01-20 DIAGNOSIS — Z331 Pregnant state, incidental: Secondary | ICD-10-CM

## 2015-01-20 LAB — POCT URINALYSIS DIPSTICK
Glucose, UA: NEGATIVE
Ketones, UA: NEGATIVE
LEUKOCYTES UA: NEGATIVE
Nitrite, UA: NEGATIVE
RBC UA: NEGATIVE

## 2015-01-20 NOTE — Progress Notes (Signed)
[id number] [redacted]w[redacted]d Estimated Date of Delivery: 03/08/15  Last menstrual period 06/01/2014, not currently breastfeeding.   BP weight and urine results all reviewed and noted.  Please refer to the obstetrical flow sheet for the fundal height and fetal heart rate documentation:  Patient reports good fetal movement, denies any bleeding and no rupture of membranes symptoms or regular contractions. Patient is without complaints. All questions were answered.  Plan:  Continued routine obstetrical care,   Follow up in 2 weeks for OB appointment,

## 2015-02-03 ENCOUNTER — Encounter: Payer: BLUE CROSS/BLUE SHIELD | Admitting: Advanced Practice Midwife

## 2015-02-09 ENCOUNTER — Encounter: Payer: Self-pay | Admitting: Obstetrics & Gynecology

## 2015-02-09 ENCOUNTER — Ambulatory Visit (INDEPENDENT_AMBULATORY_CARE_PROVIDER_SITE_OTHER): Payer: BLUE CROSS/BLUE SHIELD | Admitting: Obstetrics & Gynecology

## 2015-02-09 VITALS — BP 116/80 | HR 72 | Wt 243.0 lb

## 2015-02-09 DIAGNOSIS — Z331 Pregnant state, incidental: Secondary | ICD-10-CM

## 2015-02-09 DIAGNOSIS — Z1389 Encounter for screening for other disorder: Secondary | ICD-10-CM

## 2015-02-09 DIAGNOSIS — Z3493 Encounter for supervision of normal pregnancy, unspecified, third trimester: Secondary | ICD-10-CM

## 2015-02-09 LAB — POCT URINALYSIS DIPSTICK
Blood, UA: NEGATIVE
Glucose, UA: NEGATIVE
Ketones, UA: NEGATIVE
Leukocytes, UA: NEGATIVE
Nitrite, UA: NEGATIVE
Protein, UA: NEGATIVE

## 2015-02-09 NOTE — Progress Notes (Signed)
[id number] [redacted]w[redacted]d Estimated Date of Delivery: 03/08/15  Blood pressure 116/80, pulse 72, weight 243 lb (110.224 kg), last menstrual period 06/01/2014, not currently breastfeeding.   BP weight and urine results all reviewed and noted.  Please refer to the obstetrical flow sheet for the fundal height and fetal heart rate documentation:  Patient reports good fetal movement, denies any bleeding and no rupture of membranes symptoms or regular contractions. Patient is without complaints. All questions were answered.  Plan:  Continued routine obstetrical care,   Follow up in 1 weeks for OB appointment,

## 2015-02-15 ENCOUNTER — Encounter: Payer: Self-pay | Admitting: Obstetrics & Gynecology

## 2015-02-15 ENCOUNTER — Ambulatory Visit (INDEPENDENT_AMBULATORY_CARE_PROVIDER_SITE_OTHER): Payer: BLUE CROSS/BLUE SHIELD | Admitting: Obstetrics & Gynecology

## 2015-02-15 VITALS — BP 120/80 | HR 80 | Wt 243.0 lb

## 2015-02-15 DIAGNOSIS — Z3685 Encounter for antenatal screening for Streptococcus B: Secondary | ICD-10-CM

## 2015-02-15 DIAGNOSIS — Z3493 Encounter for supervision of normal pregnancy, unspecified, third trimester: Secondary | ICD-10-CM

## 2015-02-15 DIAGNOSIS — Z369 Encounter for antenatal screening, unspecified: Secondary | ICD-10-CM

## 2015-02-15 DIAGNOSIS — Z1389 Encounter for screening for other disorder: Secondary | ICD-10-CM

## 2015-02-15 DIAGNOSIS — Z331 Pregnant state, incidental: Secondary | ICD-10-CM

## 2015-02-15 LAB — POCT URINALYSIS DIPSTICK
Blood, UA: NEGATIVE
GLUCOSE UA: NEGATIVE
Ketones, UA: NEGATIVE
Leukocytes, UA: NEGATIVE
NITRITE UA: NEGATIVE
PROTEIN UA: NEGATIVE

## 2015-02-15 LAB — OB RESULTS CONSOLE GC/CHLAMYDIA
Chlamydia: NEGATIVE
GC PROBE AMP, GENITAL: NEGATIVE

## 2015-02-15 LAB — OB RESULTS CONSOLE GBS: GBS: NEGATIVE

## 2015-02-15 NOTE — Addendum Note (Signed)
Addended by: Doyne Keel on: 02/15/2015 11:40 AM   Modules accepted: Orders

## 2015-02-15 NOTE — Progress Notes (Signed)
[id number] [redacted]w[redacted]d Estimated Date of Delivery: 03/08/15  Blood pressure 120/80, pulse 80, weight 243 lb (110.224 kg), last menstrual period 06/01/2014, not currently breastfeeding.   BP weight and urine results all reviewed and noted.  Please refer to the obstetrical flow sheet for the fundal height and fetal heart rate documentation:  Patient reports good fetal movement, denies any bleeding and no rupture of membranes symptoms or regular contractions. Patient is without complaints. All questions were answered.  Plan:  Continued routine obstetrical care,   Follow up in 1 weeks for OB appointment,

## 2015-02-17 LAB — GC/CHLAMYDIA PROBE AMP
Chlamydia trachomatis, NAA: NEGATIVE
Neisseria gonorrhoeae by PCR: NEGATIVE

## 2015-02-19 LAB — CULTURE, BETA STREP (GROUP B ONLY): STREP GP B CULTURE: NEGATIVE

## 2015-02-23 ENCOUNTER — Ambulatory Visit (INDEPENDENT_AMBULATORY_CARE_PROVIDER_SITE_OTHER): Payer: BLUE CROSS/BLUE SHIELD | Admitting: Women's Health

## 2015-02-23 ENCOUNTER — Encounter: Payer: Self-pay | Admitting: Women's Health

## 2015-02-23 VITALS — BP 112/80 | HR 104 | Wt 240.0 lb

## 2015-02-23 DIAGNOSIS — Z331 Pregnant state, incidental: Secondary | ICD-10-CM

## 2015-02-23 DIAGNOSIS — Z3493 Encounter for supervision of normal pregnancy, unspecified, third trimester: Secondary | ICD-10-CM

## 2015-02-23 DIAGNOSIS — Z1389 Encounter for screening for other disorder: Secondary | ICD-10-CM

## 2015-02-23 LAB — POCT URINALYSIS DIPSTICK
Blood, UA: NEGATIVE
Glucose, UA: NEGATIVE
Ketones, UA: NEGATIVE
LEUKOCYTES UA: NEGATIVE
NITRITE UA: NEGATIVE
PROTEIN UA: NEGATIVE

## 2015-02-23 NOTE — Patient Instructions (Signed)
Call the office [phone]) or go to [facility] if:  You begin to have strong, frequent contractions  Your water breaks.  Sometimes it is a big gush of fluid, sometimes it is just a trickle that keeps getting your panties wet or running down your legs  You have vaginal bleeding.  It is normal to have a small amount of spotting if your cervix was checked.   You don't feel your baby moving like normal.  If you don't, get you something to eat and drink and lay down and focus on feeling your baby move.  You should feel at least 10 movements in 2 hours.  If you don't, you should call the office or go to [place] Contractions Contractions of the uterus can occur throughout pregnancy. Contractions are not always a sign that you are in labor.  WHAT ARE BRAXTON HICKS CONTRACTIONS?  Contractions that occur before labor are called Braxton Hicks contractions, or false labor. Toward the end of pregnancy (32-34 weeks), these contractions can develop more often and may become more forceful. This is not true labor because these contractions do not result in opening (dilatation) and thinning of the cervix. They are sometimes difficult to tell apart from true labor because these contractions can be forceful and people have different pain tolerances. You should not feel embarrassed if you go to the hospital with false labor. Sometimes, the only way to tell if you are in true labor is for your health care provider to look for changes in the cervix. If there are no prenatal problems or other health problems associated with the pregnancy, it is completely safe to be sent home with false labor and await the onset of true labor. HOW CAN YOU TELL THE DIFFERENCE BETWEEN TRUE AND FALSE LABOR? False Labor  The contractions of false labor are usually shorter and not as hard as those of true labor.   The contractions are usually irregular.   The contractions are often felt in the front of  the lower abdomen and in the groin.   The contractions may go away when you walk around or change positions while lying down.   The contractions get weaker and are shorter lasting as time goes on.   The contractions do not usually become progressively stronger, regular, and closer together as with true labor.  True Labor  Contractions in true labor last 30-70 seconds, become very regular, usually become more intense, and increase in frequency.   The contractions do not go away with walking.   The discomfort is usually felt in the top of the uterus and spreads to the lower abdomen and low back.   True labor can be determined by your health care provider with an exam. This will show that the cervix is dilating and getting thinner.  WHAT TO REMEMBER  Keep up with your usual exercises and follow other instructions given by your health care provider.   Take medicines as directed by your health care provider.   Keep your regular prenatal appointments.   Eat and drink lightly if you think you are going into labor.   If Braxton Hicks contractions are making you uncomfortable:   Change your position from lying down or resting to walking, or from walking to resting.   Sit and rest in a tub of warm water.   Drink 2-3 glasses of water. Dehydration may cause these contractions.   Do slow and deep breathing several times an hour.  WHEN SHOULD I SEEK IMMEDIATE MEDICAL CARE? Seek immediate medical care if:  Your contractions become stronger, more regular, and closer together.   You have fluid leaking or gushing from your vagina.   You have a fever.   You pass blood-tinged mucus.   You have vaginal bleeding.   You have continuous abdominal pain.   You have low back pain that you never had before.   You feel your baby's head pushing down and causing pelvic pressure.   Your baby is not moving as much as it used to.  Document Released: 07/23/2005 Document  Revised: 07/28/2013 Document Reviewed: 05/04/2013 [facility] Patient Information 2015 [place], [place]. This information is not intended to replace advice given to you by your health care provider. Make sure you discuss any questions you have with your health care provider.

## 2015-02-23 NOTE — Progress Notes (Signed)
Low-risk OB appointment [id number] [redacted]w[redacted]d Estimated Date of Delivery: 03/08/15 BP 112/80 mmHg  Pulse 104  Wt 240 lb (108.863 kg)  LMP 06/01/2014  BP, weight, and urine reviewed.  Refer to obstetrical flow sheet for FH & FHR.  Reports good fm.  Denies regular uc's, lof, vb, or uti s/s. No complaints. Reviewed labor s/s, fkc, gbs-.  Plan:  Continue routine obstetrical care  F/U in 1 wk for OB appointment

## 2015-03-02 ENCOUNTER — Inpatient Hospital Stay (EMERGENCY_DEPARTMENT_HOSPITAL)
Admission: AD | Admit: 2015-03-02 | Discharge: 2015-03-02 | Disposition: A | Discharge status: Home / Self Care | Payer: BLUE CROSS/BLUE SHIELD | Source: Ambulatory Visit | Attending: Obstetrics & Gynecology | Admitting: Obstetrics & Gynecology

## 2015-03-02 ENCOUNTER — Ambulatory Visit (INDEPENDENT_AMBULATORY_CARE_PROVIDER_SITE_OTHER): Payer: BLUE CROSS/BLUE SHIELD | Admitting: Women's Health

## 2015-03-02 ENCOUNTER — Encounter (HOSPITAL_COMMUNITY): Payer: Self-pay | Admitting: *Deleted

## 2015-03-02 VITALS — BP 144/90 | HR 92 | Wt 241.0 lb

## 2015-03-02 DIAGNOSIS — IMO0001 Reserved for inherently not codable concepts without codable children: Secondary | ICD-10-CM | POA: Diagnosis present

## 2015-03-02 DIAGNOSIS — R030 Elevated blood-pressure reading, without diagnosis of hypertension: Secondary | ICD-10-CM

## 2015-03-02 DIAGNOSIS — O9989 Other specified diseases and conditions complicating pregnancy, childbirth and the puerperium: Secondary | ICD-10-CM

## 2015-03-02 DIAGNOSIS — O133 Gestational [pregnancy-induced] hypertension without significant proteinuria, third trimester: Principal | ICD-10-CM | POA: Diagnosis present

## 2015-03-02 DIAGNOSIS — Z3493 Encounter for supervision of normal pregnancy, unspecified, third trimester: Secondary | ICD-10-CM

## 2015-03-02 DIAGNOSIS — Z3A39 39 weeks gestation of pregnancy: Secondary | ICD-10-CM | POA: Diagnosis present

## 2015-03-02 DIAGNOSIS — Z1389 Encounter for screening for other disorder: Secondary | ICD-10-CM

## 2015-03-02 DIAGNOSIS — Z823 Family history of stroke: Secondary | ICD-10-CM

## 2015-03-02 DIAGNOSIS — Z87891 Personal history of nicotine dependence: Secondary | ICD-10-CM | POA: Insufficient documentation

## 2015-03-02 DIAGNOSIS — O09899 Supervision of other high risk pregnancies, unspecified trimester: Secondary | ICD-10-CM

## 2015-03-02 DIAGNOSIS — O09891 Supervision of other high risk pregnancies, first trimester: Secondary | ICD-10-CM

## 2015-03-02 DIAGNOSIS — Z8249 Family history of ischemic heart disease and other diseases of the circulatory system: Secondary | ICD-10-CM

## 2015-03-02 DIAGNOSIS — Z331 Pregnant state, incidental: Secondary | ICD-10-CM

## 2015-03-02 LAB — COMPREHENSIVE METABOLIC PANEL
ALBUMIN: 2.9 g/dL — AB (ref 3.5–5.0)
ALT: 9 U/L — ABNORMAL LOW (ref 14–54)
AST: 16 U/L (ref 15–41)
Alkaline Phosphatase: 151 U/L — ABNORMAL HIGH (ref 38–126)
Anion gap: 6 (ref 5–15)
BILIRUBIN TOTAL: 0.6 mg/dL (ref 0.3–1.2)
BUN: 11 mg/dL (ref 6–20)
CHLORIDE: 105 mmol/L (ref 101–111)
CO2: 23 mmol/L (ref 22–32)
Calcium: 8.8 mg/dL — ABNORMAL LOW (ref 8.9–10.3)
Creatinine, Ser: 0.65 mg/dL (ref 0.44–1.00)
GFR calc non Af Amer: >60 mL/min (ref >60)
GLUCOSE: 84 mg/dL (ref 65–99)
POTASSIUM: 4.2 mmol/L (ref 3.5–5.1)
Sodium: 134 mmol/L — ABNORMAL LOW (ref 135–145)
TOTAL PROTEIN: 7.2 g/dL (ref 6.5–8.1)

## 2015-03-02 LAB — POCT URINALYSIS DIPSTICK
Glucose, UA: NEGATIVE
KETONES UA: NEGATIVE
Nitrite, UA: NEGATIVE
Protein, UA: NEGATIVE
RBC UA: NEGATIVE

## 2015-03-02 LAB — URINALYSIS, ROUTINE W REFLEX MICROSCOPIC
Bilirubin Urine: NEGATIVE
GLUCOSE, UA: NEGATIVE mg/dL
Hgb urine dipstick: NEGATIVE
KETONES UR: NEGATIVE mg/dL
NITRITE: NEGATIVE
PH: 7 (ref 5.0–8.0)
PROTEIN: NEGATIVE mg/dL
Specific Gravity, Urine: 1.02 (ref 1.005–1.030)
Urobilinogen, UA: 0.2 mg/dL (ref 0.0–1.0)

## 2015-03-02 LAB — PROTEIN / CREATININE RATIO, URINE
Creatinine, Urine: 221 mg/dL
Protein Creatinine Ratio: 0.14 mg/mg{Cre} (ref 0.00–0.15)
TOTAL PROTEIN, URINE: 32 mg/dL

## 2015-03-02 LAB — CBC
HEMATOCRIT: 34.6 % — AB (ref 36.0–46.0)
HEMOGLOBIN: 11.6 g/dL — AB (ref 12.0–15.0)
MCH: 27.4 pg (ref 26.0–34.0)
MCHC: 33.5 g/dL (ref 30.0–36.0)
MCV: 81.8 fL (ref 78.0–100.0)
PLATELETS: 232 10*3/uL (ref 150–400)
RBC: 4.23 MIL/uL (ref 3.87–5.11)
RDW: 13.7 % (ref 11.5–15.5)
WBC: 6.5 10*3/uL (ref 4.0–10.5)

## 2015-03-02 LAB — URINE MICROSCOPIC-ADD ON

## 2015-03-02 NOTE — Plan of Care (Signed)
Pt. Urine in lab 

## 2015-03-02 NOTE — Discharge Instructions (Signed)

## 2015-03-02 NOTE — Progress Notes (Signed)
Low-risk OB appointment [id number] [redacted]w[redacted]d Estimated Date of Delivery: 03/08/15 BP 144/90 mmHg  Pulse 92  Wt 241 lb (109.317 kg)  LMP 06/01/2014 BP recheck 138/100 BP, weight, and urine reviewed.  Refer to obstetrical flow sheet for FH & FHR.  Reports good fm.  Denies regular uc's, lof, vb, or uti s/s. Some nausea this am. Denies ha, scotomata, ruq/epigastric pain, vomiting.  No proteinuria. States had labile bp's at end of last pregnancy- no dx of pre-e/GHTN SVE per request: 3/50/-2, vtx, slightly posterior. Declines membrane sweeping.  DTRs 2+, no clonus, trace edema Given GA will send to [facility] for further eval of bp's/pre-e labs, notified resident on-call to be expecting her. If d/c'd home f/u in 2d

## 2015-03-02 NOTE — MAU Provider Note (Signed)
History     CSN: [id number]  Arrival date and time: 03/02/15 1432   None     Chief Complaint  Patient presents with  . Hypertension   HPI Comments: Pt presents from clinic for Pre-E evaluation. BPs in clinc to 144/100. Pt states she has had problems w/ HTN during previous pregnancy, but w/o complications and never needed medications. She is w/o complaint today.  Hypertension Pertinent negatives include no chest pain, headaches or shortness of breath.   +FM, denies LOF, VB, contractions, vaginal discharge.  OB History    Gravida Para Term Preterm AB TAB SAB Ectopic Multiple Living   3 2 2       2       Past Medical History  Diagnosis Date  . Depression   . Anxiety   . Bronchitis   . Plantar fasciitis   . Sinusitis, acute     Past Surgical History  Procedure Laterality Date  . Appendectomy      Family History  Problem Relation Age of Onset  . Alcohol abuse Mother   . Hypertension Father   . Depression Father   . Alcohol abuse Father   . Hyperlipidemia Father   . Stroke Father   . Cancer Maternal Grandmother     lung  . Depression Paternal Grandmother   . Kidney disease Paternal Grandfather   . Heart disease Paternal Grandfather     History  Substance Use Topics  . Smoking status: Former Smoker    Types: Cigarettes  . Smokeless tobacco: Never Used  . Alcohol Use: No    Allergies:  Allergies  Allergen Reactions  . Sulfa Antibiotics Rash    Prescriptions prior to admission  Medication Sig Dispense Refill Last Dose  . citalopram (CELEXA) 40 MG tablet Take 1 tablet (40 mg total) by mouth daily. Take 1/2 pill (20mg ) daily x 1 week, then increase to 1 pill (40mg ) daily thereafter (Patient taking differently: Take 40 mg by mouth daily. ) 30 tablet 3 03/02/2015 at Unknown time  . esomeprazole (NEXIUM) 20 MG capsule Take 20 mg by mouth daily as needed (indigestion).    Past Week at Unknown time  . ibuprofen (ADVIL,MOTRIN) 200 MG tablet Take 200 mg by mouth  every 6 (six) hours as needed for mild pain.    03/01/2015 at Unknown time  . Prenat-FeFum-FePo-FA-Omega 3 (CONCEPT DHA) 53.5-38-1 MG CAPS Take 1 capsule by mouth daily. (Patient not taking: Reported on 03/02/2015) 30 capsule 11 Completed Course at Unknown time    Review of Systems  Respiratory: Negative for shortness of breath.   Cardiovascular: Negative for chest pain and leg swelling.  Gastrointestinal: Negative for nausea, vomiting, diarrhea and constipation.  Genitourinary: Negative for urgency and frequency.  Neurological: Negative for headaches.   Physical Exam   Blood pressure 138/81, pulse 90, temperature 98.1 F (36.7 C), temperature source Oral, resp. rate 18, last menstrual period 06/01/2014, not currently breastfeeding.  Physical Exam  Constitutional: She is oriented to person, place, and time. She appears well-developed and well-nourished.  HENT:  Head: Normocephalic and atraumatic.  Eyes: Conjunctivae and EOM are normal.  Neck: Normal range of motion.  Cardiovascular: Intact distal pulses.   Respiratory: Effort normal. No respiratory distress.  GI: Soft.  Musculoskeletal: Normal range of motion.  Neurological: She is alert and oriented to person, place, and time.  Skin: Skin is warm and dry.  Psychiatric: She has a normal mood and affect. Her behavior is normal. Judgment and thought content normal.  MAU Course  Procedures  MDM Exam Labs- s/f UPC 0.14 (equivocal)  Assessment and Plan  Pt is a 29 yo f G3P2002 at [redacted]w[redacted]d who presents to MAU from clinic for elevated BP and Pre-E evaluation.  #CBC/CMP/UA/UP:C order; these are normal though UP:C is upper end of normal at 0.14 #BPs have been somewhat elevated in MAU, but not in severe range (130s/80s); at clinic, highest was 144/90 #given only one elevated BP, pt does not meet criteria for gHTN or Pre-E #will give pt 24 hr collection container for urine protein and have her f/u Friday morning 7/29 in clinic for  second BP check and urine protein (clinic will order) #labor precautions given  Reyes Ivan 03/02/2015, 4:59 PM   OB fellow attestation: I have seen and examined this patient; I agree with above documentation in the resident's note and personally discussed with attending (Dr. Harolyn Rutherford)   Zenaida Niece is a 29 y.o. [id number] reporting elevated BP in clinic +FM, denies LOF, VB, contractions, vaginal discharge.  PE: BP 127/76 mmHg  Pulse 78  Temp(Src) 98.1 F (36.7 C) (Oral)  Resp 18  LMP 06/01/2014 Gen: calm comfortable, NAD Resp: normal effort, no distress Abd: gravid  ROS, labs, PMH reviewed NST reactive   Reviewed labs: negative HELLP. UPC 0.14 Plan: - fetal kick counts reinforced, term labor precautions - 24 hours urine collection - Reviewed preeclampsia precautions and patient voiced understanding - follow up in 2 days in clinic  Caren Macadam, MD 9:06 PM

## 2015-03-02 NOTE — Patient Instructions (Signed)
Call the office [phone]) or go to [facility] if:  You begin to have strong, frequent contractions  Your water breaks.  Sometimes it is a big gush of fluid, sometimes it is just a trickle that keeps getting your panties wet or running down your legs  You have vaginal bleeding.  It is normal to have a small amount of spotting if your cervix was checked.   You don't feel your baby moving like normal.  If you don't, get you something to eat and drink and lay down and focus on feeling your baby move.  You should feel at least 10 movements in 2 hours.  If you don't, you should call the office or go to [facility].    Call the office [phone]) or go to [facility] hospital for these signs of pre-eclampsia:  Severe headache that does not go away with Tylenol  Visual changes- seeing spots, double, blurred vision  Pain under your right breast or upper abdomen that does not go away with Tums or heartburn medicine  Nausea and/or vomiting  Severe swelling in your hands, feet, and face    Braxton Hicks Contractions Contractions of the uterus can occur throughout pregnancy. Contractions are not always a sign that you are in labor.  WHAT ARE BRAXTON HICKS CONTRACTIONS?  Contractions that occur before labor are called Braxton Hicks contractions, or false labor. Toward the end of pregnancy (32-34 weeks), these contractions can develop more often and may become more forceful. This is not true labor because these contractions do not result in opening (dilatation) and thinning of the cervix. They are sometimes difficult to tell apart from true labor because these contractions can be forceful and people have different pain tolerances. You should not feel embarrassed if you go to the hospital with false labor. Sometimes, the only way to tell if you are in true labor is for your health care provider to look for changes in the cervix. If there are no prenatal problems or other health problems associated  with the pregnancy, it is completely safe to be sent home with false labor and await the onset of true labor. HOW CAN YOU TELL THE DIFFERENCE BETWEEN TRUE AND FALSE LABOR? False Labor  The contractions of false labor are usually shorter and not as hard as those of true labor.   The contractions are usually irregular.   The contractions are often felt in the front of the lower abdomen and in the groin.   The contractions may go away when you walk around or change positions while lying down.   The contractions get weaker and are shorter lasting as time goes on.   The contractions do not usually become progressively stronger, regular, and closer together as with true labor.  True Labor  Contractions in true labor last 30-70 seconds, become very regular, usually become more intense, and increase in frequency.   The contractions do not go away with walking.   The discomfort is usually felt in the top of the uterus and spreads to the lower abdomen and low back.   True labor can be determined by your health care provider with an exam. This will show that the cervix is dilating and getting thinner.  WHAT TO REMEMBER  Keep up with your usual exercises and follow other instructions given by your health care provider.   Take medicines as directed by your health care provider.   Keep your regular prenatal appointments.   Eat and drink lightly if you  think you are going into labor.   If Braxton Hicks contractions are making you uncomfortable:   Change your position from lying down or resting to walking, or from walking to resting.   Sit and rest in a tub of warm water.   Drink 2-3 glasses of water. Dehydration may cause these contractions.   Do slow and deep breathing several times an hour.  WHEN SHOULD I SEEK IMMEDIATE MEDICAL CARE? Seek immediate medical care if:  Your contractions become stronger, more regular, and closer together.   You have fluid leaking or  gushing from your vagina.   You have a fever.   You pass blood-tinged mucus.   You have vaginal bleeding.   You have continuous abdominal pain.   You have low back pain that you never had before.   You feel your baby's head pushing down and causing pelvic pressure.   Your baby is not moving as much as it used to.  Document Released: 07/23/2005 Document Revised: 07/28/2013 Document Reviewed: 05/04/2013 [facility] Patient Information 2015 [place], [place]. This information is not intended to replace advice given to you by your health care provider. Make sure you discuss any questions you have with your health care provider.

## 2015-03-02 NOTE — MAU Note (Signed)
Pt was sent from office for [facility] workup.

## 2015-03-03 ENCOUNTER — Telehealth: Payer: Self-pay | Admitting: *Deleted

## 2015-03-03 NOTE — Telephone Encounter (Signed)
-----   Message from Caren Macadam, MD sent at 03/02/2015  9:17 PM EDT ----- Regarding: BP check Friday Dear Zacarias Pontes,  I am Lubertha Sayres (the new OB fellow at [facility]). I saw ms. Sarra in the MAU. She was evaluated for elevated BP. Her work up was negative for preeclampsia and her BP was lower here. We gave her a 24 urine and asked her to follow up Friday for BP check.   Can you help schedule her for an appt?  Thanks, Maudie Mercury

## 2015-03-04 ENCOUNTER — Encounter: Payer: Self-pay | Admitting: Obstetrics & Gynecology

## 2015-03-04 ENCOUNTER — Ambulatory Visit (INDEPENDENT_AMBULATORY_CARE_PROVIDER_SITE_OTHER): Payer: BLUE CROSS/BLUE SHIELD | Admitting: Obstetrics & Gynecology

## 2015-03-04 VITALS — BP 100/80 | HR 72 | Wt 241.0 lb

## 2015-03-04 DIAGNOSIS — Z3493 Encounter for supervision of normal pregnancy, unspecified, third trimester: Secondary | ICD-10-CM | POA: Diagnosis not present

## 2015-03-04 DIAGNOSIS — Z1389 Encounter for screening for other disorder: Secondary | ICD-10-CM

## 2015-03-04 DIAGNOSIS — IMO0001 Reserved for inherently not codable concepts without codable children: Secondary | ICD-10-CM

## 2015-03-04 DIAGNOSIS — Z331 Pregnant state, incidental: Secondary | ICD-10-CM

## 2015-03-04 DIAGNOSIS — R030 Elevated blood-pressure reading, without diagnosis of hypertension: Secondary | ICD-10-CM

## 2015-03-04 LAB — POCT URINALYSIS DIPSTICK
Blood, UA: NEGATIVE
GLUCOSE UA: NEGATIVE
Ketones, UA: NEGATIVE
Leukocytes, UA: NEGATIVE
NITRITE UA: NEGATIVE

## 2015-03-04 NOTE — Addendum Note (Signed)
Addended by: Doyne Keel on: 03/04/2015 11:39 AM   Modules accepted: Orders

## 2015-03-04 NOTE — Addendum Note (Signed)
Addended by: Doyne Keel on: 03/04/2015 11:27 AM   Modules accepted: Orders

## 2015-03-04 NOTE — Progress Notes (Signed)
BP good today Only other complaint is GERD: double up nexium and use tums prn  G3P2002 [redacted]w[redacted]d Estimated Date of Delivery: 03/08/15  Blood pressure 100/80, pulse 72, weight 241 lb (109.317 kg), last menstrual period 06/01/2014, not currently breastfeeding.   BP weight and urine results all reviewed and noted.  Please refer to the obstetrical flow sheet for the fundal height and fetal heart rate documentation:  Patient reports good fetal movement, denies any bleeding and no rupture of membranes symptoms or regular contractions. Patient is without complaints. All questions were answered.  Plan:  Continued routine obstetrical care,   Follow up in wednesday weeks for OB appointment,

## 2015-03-05 ENCOUNTER — Inpatient Hospital Stay (HOSPITAL_COMMUNITY): Payer: BLUE CROSS/BLUE SHIELD | Admitting: Anesthesiology

## 2015-03-05 ENCOUNTER — Encounter (HOSPITAL_COMMUNITY): Payer: Self-pay | Admitting: *Deleted

## 2015-03-05 ENCOUNTER — Inpatient Hospital Stay (HOSPITAL_COMMUNITY)
Admission: AD | Admit: 2015-03-05 | Discharge: 2015-03-07 | DRG: 775 | Disposition: A | Discharge status: Home / Self Care | Payer: BLUE CROSS/BLUE SHIELD | Source: Ambulatory Visit | Attending: Family Medicine | Admitting: Family Medicine

## 2015-03-05 DIAGNOSIS — Z87891 Personal history of nicotine dependence: Secondary | ICD-10-CM

## 2015-03-05 DIAGNOSIS — O133 Gestational [pregnancy-induced] hypertension without significant proteinuria, third trimester: Secondary | ICD-10-CM

## 2015-03-05 DIAGNOSIS — Z8249 Family history of ischemic heart disease and other diseases of the circulatory system: Secondary | ICD-10-CM

## 2015-03-05 DIAGNOSIS — O9989 Other specified diseases and conditions complicating pregnancy, childbirth and the puerperium: Secondary | ICD-10-CM | POA: Diagnosis present

## 2015-03-05 DIAGNOSIS — O8823 Thromboembolism in the puerperium: Secondary | ICD-10-CM

## 2015-03-05 DIAGNOSIS — IMO0001 Reserved for inherently not codable concepts without codable children: Secondary | ICD-10-CM

## 2015-03-05 DIAGNOSIS — Z823 Family history of stroke: Secondary | ICD-10-CM

## 2015-03-05 DIAGNOSIS — R030 Elevated blood-pressure reading, without diagnosis of hypertension: Secondary | ICD-10-CM

## 2015-03-05 DIAGNOSIS — Z3A39 39 weeks gestation of pregnancy: Secondary | ICD-10-CM

## 2015-03-05 DIAGNOSIS — O09891 Supervision of other high risk pregnancies, first trimester: Secondary | ICD-10-CM

## 2015-03-05 DIAGNOSIS — Z3493 Encounter for supervision of normal pregnancy, unspecified, third trimester: Secondary | ICD-10-CM

## 2015-03-05 LAB — CBC
HEMATOCRIT: 33.6 % — AB (ref 36.0–46.0)
HEMATOCRIT: 31.4 % — AB (ref 36.0–46.0)
HEMOGLOBIN: 10.3 g/dL — AB (ref 12.0–15.0)
Hemoglobin: 11.2 g/dL — ABNORMAL LOW (ref 12.0–15.0)
MCH: 27.2 pg (ref 26.0–34.0)
MCH: 26.8 pg (ref 26.0–34.0)
MCHC: 33.3 g/dL (ref 30.0–36.0)
MCHC: 32.8 g/dL (ref 30.0–36.0)
MCV: 81.6 fL (ref 78.0–100.0)
MCV: 81.8 fL (ref 78.0–100.0)
Platelets: 221 10*3/uL (ref 150–400)
Platelets: 198 10*3/uL (ref 150–400)
RBC: 4.12 MIL/uL (ref 3.87–5.11)
RBC: 3.84 MIL/uL — AB (ref 3.87–5.11)
RDW: 13.8 % (ref 11.5–15.5)
RDW: 14 % (ref 11.5–15.5)
WBC: 5.1 10*3/uL (ref 4.0–10.5)
WBC: 10.5 10*3/uL (ref 4.0–10.5)

## 2015-03-05 LAB — RPR: RPR: NONREACTIVE

## 2015-03-05 MED ORDER — WITCH HAZEL-GLYCERIN EX PADS: 1.0000 "application " | MEDICATED_PAD | CUTANEOUS | Status: DC | PRN

## 2015-03-05 MED ORDER — OXYCODONE-ACETAMINOPHEN 5-325 MG PO TABS: 2.0000 | ORAL_TABLET | ORAL | Status: DC | PRN

## 2015-03-05 MED ORDER — DIPHENHYDRAMINE HCL 50 MG/ML IJ SOLN: 12.5000 mg | INTRAMUSCULAR | Status: DC | PRN

## 2015-03-05 MED ORDER — LANOLIN HYDROUS EX OINT: TOPICAL_OINTMENT | CUTANEOUS | Status: DC | PRN

## 2015-03-05 MED ORDER — ZOLPIDEM TARTRATE 5 MG PO TABS: 5.0000 mg | ORAL_TABLET | Freq: Every evening | ORAL | Status: DC | PRN

## 2015-03-05 MED ORDER — CITRIC ACID-SODIUM CITRATE 334-500 MG/5ML PO SOLN
30.0000 mL | ORAL | Status: DC | PRN
  Administered 2015-03-05: 30 mL via ORAL
  Filled 2015-03-05: qty 15

## 2015-03-05 MED ORDER — TERBUTALINE SULFATE 1 MG/ML IJ SOLN
0.2500 mg | Freq: Once | INTRAMUSCULAR | Status: DC | PRN
  Filled 2015-03-05: qty 1

## 2015-03-05 MED ORDER — DIBUCAINE 1 % RE OINT: 1.0000 "application " | TOPICAL_OINTMENT | RECTAL | Status: DC | PRN

## 2015-03-05 MED ORDER — OXYTOCIN 40 UNITS IN LACTATED RINGERS INFUSION - SIMPLE MED
62.5000 mL/h | INTRAVENOUS | Status: DC
  Administered 2015-03-05: 62.5 mL/h via INTRAVENOUS

## 2015-03-05 MED ORDER — EPHEDRINE 5 MG/ML INJ
10.0000 mg | INTRAVENOUS | Status: DC | PRN
  Filled 2015-03-05: qty 2

## 2015-03-05 MED ORDER — DIPHENHYDRAMINE HCL 25 MG PO CAPS: 25.0000 mg | ORAL_CAPSULE | Freq: Four times a day (QID) | ORAL | Status: DC | PRN

## 2015-03-05 MED ORDER — OXYCODONE-ACETAMINOPHEN 5-325 MG PO TABS: 1.0000 | ORAL_TABLET | ORAL | Status: DC | PRN

## 2015-03-05 MED ORDER — ONDANSETRON HCL 4 MG PO TABS: 4.0000 mg | ORAL_TABLET | ORAL | Status: DC | PRN

## 2015-03-05 MED ORDER — LACTATED RINGERS IV SOLN
INTRAVENOUS | Status: DC
  Administered 2015-03-05 (×2): via INTRAVENOUS

## 2015-03-05 MED ORDER — PRENATAL MULTIVITAMIN CH
1.0000 | ORAL_TABLET | Freq: Every day | ORAL | Status: DC
  Administered 2015-03-06 – 2015-03-07 (×2): 1 via ORAL
  Filled 2015-03-05 (×2): qty 1

## 2015-03-05 MED ORDER — SENNOSIDES-DOCUSATE SODIUM 8.6-50 MG PO TABS
2.0000 | ORAL_TABLET | ORAL | Status: DC
  Administered 2015-03-06 (×2): 2 via ORAL
  Filled 2015-03-05 (×2): qty 2

## 2015-03-05 MED ORDER — LIDOCAINE HCL (PF) 1 % IJ SOLN
30.0000 mL | INTRAMUSCULAR | Status: DC | PRN
  Filled 2015-03-05: qty 30

## 2015-03-05 MED ORDER — IBUPROFEN 600 MG PO TABS
600.0000 mg | ORAL_TABLET | Freq: Four times a day (QID) | ORAL | Status: DC
  Administered 2015-03-05 – 2015-03-07 (×8): 600 mg via ORAL
  Filled 2015-03-05 (×8): qty 1

## 2015-03-05 MED ORDER — CITALOPRAM HYDROBROMIDE 40 MG PO TABS
40.0000 mg | ORAL_TABLET | Freq: Every day | ORAL | Status: DC
  Administered 2015-03-05 – 2015-03-07 (×3): 40 mg via ORAL
  Filled 2015-03-05 (×4): qty 1

## 2015-03-05 MED ORDER — PHENYLEPHRINE 40 MCG/ML (10ML) SYRINGE FOR IV PUSH (FOR BLOOD PRESSURE SUPPORT)
80.0000 ug | PREFILLED_SYRINGE | INTRAVENOUS | Status: DC | PRN
  Filled 2015-03-05: qty 2
  Filled 2015-03-05: qty 20

## 2015-03-05 MED ORDER — LIDOCAINE HCL (PF) 1 % IJ SOLN
INTRAMUSCULAR | Status: DC | PRN
  Administered 2015-03-05: 3 mL
  Administered 2015-03-05 (×2): 5 mL

## 2015-03-05 MED ORDER — ACETAMINOPHEN 325 MG PO TABS: 650.0000 mg | ORAL_TABLET | ORAL | Status: DC | PRN

## 2015-03-05 MED ORDER — OXYTOCIN BOLUS FROM INFUSION
500.0000 mL | INTRAVENOUS | Status: DC
  Administered 2015-03-05: 500 mL via INTRAVENOUS

## 2015-03-05 MED ORDER — SIMETHICONE 80 MG PO CHEW: 80.0000 mg | CHEWABLE_TABLET | ORAL | Status: DC | PRN

## 2015-03-05 MED ORDER — OXYCODONE-ACETAMINOPHEN 5-325 MG PO TABS
1.0000 | ORAL_TABLET | ORAL | Status: DC | PRN
  Administered 2015-03-05 – 2015-03-07 (×4): 1 via ORAL
  Filled 2015-03-05 (×4): qty 1

## 2015-03-05 MED ORDER — BUPIVACAINE HCL (PF) 0.75 % IJ SOLN
14.0000 mL/h | INTRAMUSCULAR | Status: DC | PRN
  Filled 2015-03-05: qty 17

## 2015-03-05 MED ORDER — FENTANYL 2.5 MCG/ML BUPIVACAINE 1/10 % EPIDURAL INFUSION (WH - ANES)
14.0000 mL/h | INTRAMUSCULAR | Status: DC | PRN
  Administered 2015-03-05 (×2): 14 mL/h via EPIDURAL
  Filled 2015-03-05: qty 125

## 2015-03-05 MED ORDER — TETANUS-DIPHTH-ACELL PERTUSSIS 5-2.5-18.5 LF-MCG/0.5 IM SUSP: 0.5000 mL | Freq: Once | INTRAMUSCULAR | Status: DC

## 2015-03-05 MED ORDER — BENZOCAINE-MENTHOL 20-0.5 % EX AERO
1.0000 "application " | INHALATION_SPRAY | CUTANEOUS | Status: DC | PRN
  Administered 2015-03-05: 1 via TOPICAL
  Filled 2015-03-05: qty 56

## 2015-03-05 MED ORDER — OXYTOCIN 40 UNITS IN LACTATED RINGERS INFUSION - SIMPLE MED
1.0000 m[IU]/min | INTRAVENOUS | Status: DC
  Administered 2015-03-05: 2 m[IU]/min via INTRAVENOUS
  Filled 2015-03-05: qty 1000

## 2015-03-05 MED ORDER — FLEET ENEMA 7-19 GM/118ML RE ENEM
1.0000 | ENEMA | RECTAL | Status: DC | PRN
Start: 2015-03-05 — End: 2015-03-05

## 2015-03-05 MED ORDER — ONDANSETRON HCL 4 MG/2ML IJ SOLN: 4.0000 mg | Freq: Four times a day (QID) | INTRAMUSCULAR | Status: DC | PRN

## 2015-03-05 MED ORDER — LACTATED RINGERS IV SOLN
500.0000 mL | INTRAVENOUS | Status: DC | PRN
  Administered 2015-03-05: 500 mL via INTRAVENOUS

## 2015-03-05 MED ORDER — OXYCODONE-ACETAMINOPHEN 5-325 MG PO TABS
2.0000 | ORAL_TABLET | ORAL | Status: DC | PRN
  Administered 2015-03-06: 2 via ORAL
  Filled 2015-03-05: qty 2

## 2015-03-05 MED ORDER — PANTOPRAZOLE SODIUM 20 MG PO TBEC
20.0000 mg | DELAYED_RELEASE_TABLET | Freq: Two times a day (BID) | ORAL | Status: DC
  Administered 2015-03-05 – 2015-03-06 (×3): 20 mg via ORAL
  Filled 2015-03-05 (×4): qty 1

## 2015-03-05 MED ORDER — ONDANSETRON HCL 4 MG/2ML IJ SOLN: 4.0000 mg | INTRAMUSCULAR | Status: DC | PRN

## 2015-03-05 NOTE — Anesthesia Procedure Notes (Signed)
Epidural Patient location during procedure: OB  Staffing Anesthesiologist: Montez Hageman Performed by: anesthesiologist   Preanesthetic Checklist Completed: patient identified, site marked, surgical consent, pre-op evaluation, timeout performed, IV checked, risks and benefits discussed and monitors and equipment checked  Epidural Patient position: sitting Prep: DuraPrep Patient monitoring: heart rate, continuous pulse ox and blood pressure Approach: right paramedian Location: L3-L4 Injection technique: LOR saline  Needle:  Needle type: Tuohy  Needle gauge: 17 G Needle length: 9 cm and 9 Needle insertion depth: 6 cm Catheter type: closed end flexible Catheter size: 20 Guage Catheter at skin depth: 11 cm Test dose: negative  Assessment Events: blood not aspirated, injection not painful, no injection resistance, negative IV test and no paresthesia  Additional Notes Patient identified. Risks/Benefits/Options discussed with patient including but not limited to bleeding, infection, nerve damage, paralysis, failed block, incomplete pain control, headache, blood pressure changes, nausea, vomiting, reactions to medication both or allergic, itching and postpartum back pain. Confirmed with bedside nurse the patient's most recent platelet count. Confirmed with patient that they are not currently taking any anticoagulation, have any bleeding history or any family history of bleeding disorders. Patient expressed understanding and wished to proceed. All questions were answered. Sterile technique was used throughout the entire procedure. Please see nursing notes for vital signs. Test dose was given through epidural needle and negative prior to continuing to dose epidural or start infusion. Warning signs of high block given to the patient including shortness of breath, tingling/numbness in hands, complete motor block, or any concerning symptoms with instructions to call for help. Patient was given  instructions on fall risk and not to get out of bed. All questions and concerns addressed with instructions to call with any issues.

## 2015-03-05 NOTE — H&P (Signed)
Neyra Pettie is a 29 y.o. female presenting for SOL; SROM at 0630 this morning. She complains of no pain/contractions/N/V/VB. She was seen in MAU 2 days ago 2/2 elevated BP, but Pre-E labs were normal (though Pr:Cr upper range of normal). Completed 24 hr urine protein. Reports that she had labile BPs near term at last pregnancy, but no Dx Pre-E or gHTN. Maternal Medical History:  Reason for admission: Rupture of membranes.  Nausea.  Fetal activity: Perceived fetal activity is normal.    Prenatal complications: PIH.     OB History    Gravida Para Term Preterm AB TAB SAB Ectopic Multiple Living   3 2 2       2      Past Medical History  Diagnosis Date  . Depression   . Anxiety   . Bronchitis   . Plantar fasciitis   . Sinusitis, acute    Past Surgical History  Procedure Laterality Date  . Appendectomy     Family History: family history includes Alcohol abuse in her father and mother; Cancer in her maternal grandmother; Depression in her father and paternal grandmother; Heart disease in her paternal grandfather; Hyperlipidemia in her father; Hypertension in her father; Kidney disease in her paternal grandfather; Stroke in her father. Social History:  reports that she has quit smoking. Her smoking use included Cigarettes. She has never used smokeless tobacco. She reports that she does not drink alcohol or use illicit drugs.   [place], [place], 3rd baby, married  Dating By LMP c/w 11wk u/s  Pap 2015 [place]- neg  GC/CT Initial:   -/-             36+wks:  Genetic Screen NT/IT: negative  CF screen neg  Anatomic [place] Normal female  Flu vaccine Oct 2015 @ [facility]  Tdap Recommended ~ 28wks  Glucose Screen  2 hr normal 84/164/122  GBS neg  Feed Preference breast  Contraception Undecided, discussed  Circumcision n/a  Childbirth Classes Not interested  Pediatrician [facility]      Prenatal Transfer Tool  Maternal Diabetes: No Genetic Screening:  Normal Maternal Ultrasounds/Referrals: Normal Fetal Ultrasounds or other Referrals:  None Maternal Substance Abuse:  No Significant Maternal Medications:  Meds include: Other: celexa Significant Maternal Lab Results:  Lab values include: Rh negative Other Comments:  None  Review of Systems  Constitutional: Negative for fever.  Respiratory: Negative for shortness of breath.   Cardiovascular: Negative for chest pain and leg swelling.  Gastrointestinal: Negative for nausea, vomiting, abdominal pain, diarrhea and constipation.  Genitourinary: Negative for urgency and frequency.  Neurological: Negative for headaches.    Dilation: 4.5 Effacement (%): 60 Station: -2 Exam by:: k Kerrington Sova cnm Blood pressure 145/77, pulse 81, temperature 98.1 F (36.7 C), temperature source Oral, resp. rate 20, height 5\' 4"  (1.626 m), weight 109.317 kg (241 lb), last menstrual period 06/01/2014, not currently breastfeeding.   Fetal Exam Fetal Monitor Review: Baseline rate: 130s.  Variability: moderate (6-25 bpm).   Pattern: accelerations present and no decelerations.    Fetal State Assessment: Category I - tracings are normal.     Physical Exam  Constitutional: She is oriented to person, place, and time. She appears well-developed and well-nourished. No distress.  HENT:  Head: Normocephalic and atraumatic.  Eyes: Conjunctivae and EOM are normal.  Neck: Normal range of motion. No tracheal deviation present.  Cardiovascular: Intact distal pulses.   Respiratory: Effort normal. No respiratory distress.  GI: Soft. She exhibits no  distension. There is no tenderness. There is no rebound and no guarding.  Musculoskeletal: Normal range of motion.  Neurological: She is alert and oriented to person, place, and time.  Skin: Skin is warm and dry. She is not diaphoretic.  Psychiatric: She has a normal mood and affect. Her behavior is normal. Judgment and thought content normal.    Prenatal labs: ABO, Rh:  B/NEG/-- (01/25 1146) Antibody: Negative (05/12 0904) Rubella: 3.45 (01/25 1146) RPR: Non Reactive (05/12 0904)  HBsAg: NEGATIVE (01/25 1146)  HIV: NONREACTIVE (01/25 1146)  GBS: Negative (07/12 0000)   Assessment/Plan: Pt is a 29 yo f G3P2002 at [redacted]w[redacted]d who presented w/ SROM at 0630 this morning. She is currently 0.5/90/-3 and not having significant contractions. She had elevated BP in clinic 2 days ago and another on L&D.  SOL #normal labor orders in place #expecting normal vag delivery #will order epidural now given cervical exam #will check q2 hrs for cervical change and augment as needed, will likely start pit 2x2 given contraction irregularity/strength  Elevated BP #similar problem at term w/ previous pregnancy #likely gHTN as no signs of Pre-E #will monitor BP and tx w/ IV labetalol prn  Reyes Ivan 03/05/2015, 10:05 AM  I spoke with and examined patient and agree with resident/PA/SNM's note and plan of care.  SROM light mec @ 0630, mild irregular uc's, +FM, no vb Denies ha, scotomata, ruq/epigastric pain, n/v.   DTRs 2+, no clonus, trace edema Was 3/50/-2 by my exam in office on 7/27 at which time she was sent to [facility] for pre-e eval d/t 2 elevated bp's in office. Pre-e work-up was neg w/ normal P:C ratio 0.14, was seen back in office on 7/29 w/ normal bp. Presents today w/ all elevated bp's 140s-150s/70-80s.  Cx 4-5/60/-2, vtx, will get epidural (missed getting epidural w/ one delivery and doesn't want that to happen again), and then start pitocin per protocol.  Plans to breastfeed, undecided about contraception- has been discussed.   Roma Schanz, CNM, [facility] 03/05/2015 12:22 PM

## 2015-03-05 NOTE — MAU Note (Signed)
PT states that her water broke at 0630 this morning, light yellow fluid, small amount.  Denies cramping/vb.

## 2015-03-05 NOTE — Anesthesia Preprocedure Evaluation (Signed)
Anesthesia Evaluation  Patient identified by MRN, date of birth, ID band Patient awake    Reviewed: Allergy & Precautions, H&P , NPO status , Patient's Chart, lab work & pertinent test results  History of Anesthesia Complications Negative for: history of anesthetic complications  Airway Mallampati: II  TM Distance: >3 FB Neck ROM: full    Dental no notable dental hx. (+) Teeth Intact   Pulmonary neg pulmonary ROS, former smoker,  breath sounds clear to auscultation  Pulmonary exam normal       Cardiovascular negative cardio ROS Normal cardiovascular examRhythm:regular Rate:Normal     Neuro/Psych negative neurological ROS  negative psych ROS   GI/Hepatic negative GI ROS, Neg liver ROS,   Endo/Other  negative endocrine ROS  Renal/GU negative Renal ROS  negative genitourinary   Musculoskeletal   Abdominal   Peds  Hematology negative hematology ROS (+)   Anesthesia Other Findings   Reproductive/Obstetrics (+) Pregnancy                             Anesthesia Physical Anesthesia Plan  ASA: II  Anesthesia Plan: Epidural   Post-op Pain Management:    Induction:   Airway Management Planned:   Additional Equipment:   Intra-op Plan:   Post-operative Plan:   Informed Consent: I have reviewed the patients History and Physical, chart, labs and discussed the procedure including the risks, benefits and alternatives for the proposed anesthesia with the patient or authorized representative who has indicated his/her understanding and acceptance.     Plan Discussed with:   Anesthesia Plan Comments:         Anesthesia Quick Evaluation

## 2015-03-05 NOTE — Progress Notes (Signed)
Labor Progress Note  S: Pt seen w/ RN at bedside; she has received epidural and currently w/o complaint. She is not in any pain at the moment  O:  BP 137/81 mmHg  Pulse 71  Temp(Src) 98 F (36.7 C) (Oral)  Resp 18  Ht 5\' 4"  (1.626 m)  Wt 109.317 kg (241 lb)  BMI 41.35 kg/m2  SpO2 99%  LMP 06/01/2014 FHR 120s, mod vari, +acels, -decels CVE: Dilation: 5 Effacement (%): 70 Cervical Position: Middle Station: -2 Presentation: Vertex Exam by:: h stone rnc   A&P: 29 y.o. [id number] [redacted]w[redacted]d progressing normally towards delivery #will start pit to increase regularity and strength of contractions #q2 hr checks  Cheryl Ivan, MD 1:53 PM

## 2015-03-06 MED ORDER — RHO D IMMUNE GLOBULIN 1500 UNIT/2ML IJ SOSY
300.0000 ug | PREFILLED_SYRINGE | Freq: Once | INTRAMUSCULAR | Status: AC
  Administered 2015-03-06: 300 ug via INTRAVENOUS
  Filled 2015-03-06: qty 2

## 2015-03-06 NOTE — Lactation Note (Addendum)
This note was copied from the chart of Cheryl Bowman. Lactation Consultation Note; Mom reports baby nursed well after delivery but has been sleepy today. Last fed at 6:30, had an attempt since but too sleepy. Baby has hiccups- on and off the breast. When hiccups gone, continues on and off the breast. Mom easily able to hand express Colostrum. Encouragement given.RN had given NS through the night but we did not use at this feeding.  BF brochure given with resources for support after DC. To call for assist prn  Patient Name: Cheryl Kyliana Standen Today's Date: 03/06/2015 Reason for consult: Initial assessment   Maternal Data Formula Feeding for Exclusion: No Has patient been taught Hand Expression?: Yes Does the patient have breastfeeding experience prior to this delivery?: Yes  Feeding Feeding Type: Breast Fed Length of feed: 10 min  LATCH Score/Interventions Latch: Repeated attempts needed to sustain latch, nipple held in mouth throughout feeding, stimulation needed to elicit sucking reflex.  Audible Swallowing: None  Type of Nipple: Everted at rest and after stimulation  Comfort (Breast/Nipple): Soft / non-tender     Hold (Positioning): Assistance needed to correctly position infant at breast and maintain latch. Intervention(s): Breastfeeding basics reviewed;Skin to skin  LATCH Score: 6  Lactation Tools Discussed/Used     Consult Status Consult Status: Follow-up Date: 03/07/15 Follow-up type: In-patient    Truddie Crumble 03/06/2015, 12:24 PM

## 2015-03-06 NOTE — Anesthesia Postprocedure Evaluation (Signed)
  Anesthesia Post-op Note  Patient: Cheryl Bowman  Anesthesia Post-op Note  Patient: Cheryl Bowman  Procedure(s) Performed: * No procedures listed *  Patient Location: PACU and Mother/Baby  Anesthesia Type:Epidural  Level of Consciousness: awake, alert  and oriented  Airway and Oxygen Therapy: Patient Spontanous Breathing  Post-op Pain: none  Post-op Assessment: Post-op Vital signs reviewed, Patient's Cardiovascular Status Stable, No headache, No backache, No residual numbness and No residual motor weakness  Post-op Vital Signs: Reviewed and stable  Complications: No apparent anesthesia complications

## 2015-03-06 NOTE — Lactation Note (Signed)
This note was copied from the chart of Cheryl Zenaida Niece. Lactation Consultation Note' Mom called for assist- baby fussy and would not latch. Tried with NS- baby on and off the breast. Mom easily able to hand express Colostrum. Baby final calmed and continues on and off the breast. Off to sleep. Skin to skin with mom. To call for assist prn  Patient Name: Cheryl Bowman [id number] Date: 03/06/2015 Reason for consult: Follow-up assessment   Maternal Data Formula Feeding for Exclusion: No Has patient been taught Hand Expression?: Yes Does the patient have breastfeeding experience prior to this delivery?: Yes  Feeding Feeding Type: Breast Fed Length of feed: 15 min (on and off)  LATCH Score/Interventions Latch: Repeated attempts needed to sustain latch, nipple held in mouth throughout feeding, stimulation needed to elicit sucking reflex.  Audible Swallowing: None  Type of Nipple: Everted at rest and after stimulation  Comfort (Breast/Nipple): Soft / non-tender     Hold (Positioning): Assistance needed to correctly position infant at breast and maintain latch. Intervention(s): Breastfeeding basics reviewed  LATCH Score: 6  Lactation Tools Discussed/Used     Consult Status Consult Status: Follow-up Date: 03/07/15 Follow-up type: In-patient    Truddie Crumble 03/06/2015, 1:58 PM

## 2015-03-06 NOTE — Progress Notes (Signed)
Acknowledged order for social work consult regarding mother's hx depression * Referral screened out by Clinical Social Worker because none of the following criteria appear to apply:  ~ History of anxiety/depression during this pregnancy, or of post-partum depression.  ~ Diagnosis of anxiety and/or depression within last 3 years  ~ History of depression due to pregnancy loss/loss of child  OR  * Patient's symptoms currently being treated with Celexa.  CSW completed chart review and spoke with MOB's nurse.  RN reported that mother is bonding and interacting well with newborn.   Please contact the Clinical Social Worker if needs arise, or by the patient's request.

## 2015-03-06 NOTE — Progress Notes (Signed)
Post Partum Day 1 Subjective: Eating, drinking, voiding, ambulating well.  +flatus.  Lochia and pain wnl.  Denies dizziness, lightheadedness, or sob. No complaints.  Nurse says baby not latching well and pt requiring percocet, so thinks she should stay another night  Objective: Blood pressure 114/58, pulse 67, temperature 97.8 F (36.6 C), temperature source Axillary, resp. rate 16, height 5\' 4"  (1.626 m), weight 109.317 kg (241 lb), last menstrual period 06/01/2014, SpO2 99 %, unknown if currently breastfeeding.  Physical Exam:  General: alert, cooperative and no distress Lochia: appropriate Uterine Fundus: firm Incision: n/a DVT Evaluation: No evidence of DVT seen on physical exam. Negative Homan's sign. No cords or calf tenderness. No significant calf/ankle edema.   Recent Labs  03/05/15 0905 03/05/15 1744  HGB 11.2* 10.3*  HCT 33.6* 31.4*    Assessment/Plan: Plan for discharge tomorrow and Breastfeeding  POPs for contraception   LOS: 1 day   Tawnya Crook 03/06/2015, 8:40 AM

## 2015-03-07 LAB — RH IG WORKUP (INCLUDES ABO/RH)
ABO/RH(D): B NEG
DAT, IGG: NEGATIVE
Fetal Screen: NEGATIVE
GESTATIONAL AGE(WKS): 39.4
Unit division: 0

## 2015-03-07 MED ORDER — CITALOPRAM HYDROBROMIDE 40 MG PO TABS: 40.0000 mg | ORAL_TABLET | Freq: Every day | ORAL | Status: DC

## 2015-03-07 MED ORDER — NORETHINDRONE 0.35 MG PO TABS: 1.0000 | ORAL_TABLET | Freq: Every day | ORAL | Status: DC

## 2015-03-07 MED ORDER — OXYCODONE-ACETAMINOPHEN 5-325 MG PO TABS: 1.0000 | ORAL_TABLET | ORAL | Status: DC | PRN

## 2015-03-07 MED ORDER — IBUPROFEN 600 MG PO TABS
600.0000 mg | ORAL_TABLET | Freq: Four times a day (QID) | ORAL | Status: DC
Start: 2015-03-07 — End: 2017-02-18

## 2015-03-07 NOTE — Progress Notes (Signed)
CSW received call from [facility] RN with concerns of flat affect.  CSW met with MOB and FOB in first floor room/111 to complete assessment and offer support.  Parents were pleasant and welcoming.  Both were quiet, but state they are doing well.  MOB reports that labor was "long" and that baby "took her time."  She reports feeling well physically and emotionally at this time.  She states she has been taking an antidepressant for Anxiety and Depression since she was 29 years old.  She reports this works well for her and that she does not feel she needs a Social worker at this time.  She reports no hx of PDD after her other deliveries.  Parents were attentive to information given by CSW on signs and symptoms of PPD.  She reports feeling comfortable with her medical providers and commits to speaking with her doctor if she has concerns about her emotions/mental health at any time.  MOB smiled as she talked about her new baby, as well as being eager to get home to her 27 year old, who has not yet met the baby.  Parents report no questions or needs.  CSW identifies no barriers to discharge.

## 2015-03-07 NOTE — Lactation Note (Signed)
This note was copied from the chart of Girl Cheryl Bowman. Lactation Consultation Note Dr. Earle Gell asked me to give mom a referal list for tongue-ties d/t thickness. I did so and discussed pumping and bottle feeding. Mom stated she is fine w/pumping and bottle feeding the baby. Discussed supply and demand and how important pumping on a schedule. Discussed storing milk, supplementing until milk comes in and having tongue tie fixed, if she wants to BF then to call for f/u appt. And see if she can get a consult w/LC on the same day.  Patient Name: Girl Lakeasha Petion YMEBR'[id number] Date: 03/07/2015 Reason for consult: Follow-up assessment;Difficult latch;Infant weight loss   Maternal Data    Feeding Feeding Type: Formula  LATCH Score/Interventions Latch: Repeated attempts needed to sustain latch, nipple held in mouth throughout feeding, stimulation needed to elicit sucking reflex. Intervention(s): Adjust position;Assist with latch;Breast massage;Breast compression  Audible Swallowing: None Intervention(s): Skin to skin;Hand expression Intervention(s): Hand expression;Alternate breast massage  Type of Nipple: Everted at rest and after stimulation  Comfort (Breast/Nipple): Filling, red/small blisters or bruises, mild/mod discomfort  Problem noted: Mild/Moderate discomfort Interventions (Mild/moderate discomfort): Post-pump  Hold (Positioning): Assistance needed to correctly position infant at breast and maintain latch. Intervention(s): Support Pillows;Position options;Skin to skin  LATCH Score: 5  Lactation Tools Discussed/Used Tools: Shells;Nipple Shields;Pump;Comfort gels Nipple shield size: 20 Shell Type: Inverted Breast pump type: Manual Pump Review: Setup, frequency, and cleaning;Milk Storage Initiated by:: Allayne Stack RN Date initiated:: 03/07/15   Consult Status Consult Status: Follow-up Date: 03/07/15 (in pm) Follow-up type: In-patient    Yasha Tibbett, [place] 03/07/2015, 10:15  AM

## 2015-03-07 NOTE — Lactation Note (Signed)
This note was copied from the chart of Cheryl Zenaida Niece. Lactation Consultation Note Baby had 6% weight loss. Mom short shaft nipples wearing #20 NS. I assisted in latching baby. Baby doesn't appear to be getting a transfer in [place]. NS doesn't have some colostrum dried inside from last feeding, but no transfer at this time. Hand expression and breast massage got a few drops. Baby acts like its is hungry, fussy at breast. Asked mom if we could supplement and put it in the NS. Replied yes. Baby latched well and BF great.  When baby was crying noted heart shaped tongue. W/gloved finger assed suck, keeps tongue behind gum line. Baby chomps instead of suckle and looses seal in corners of mouth. Attempted suck training. Had to work w/baby to initate to BF.  Comfort gels for sore nipples, shells given to assist nipples into inverting, and a hand pump to stimulate breast.  Patient Name: Cheryl Bowman Today's Date: 03/07/2015 Reason for consult: Follow-up assessment;Difficult latch;Infant weight loss   Maternal Data    Feeding Feeding Type: Formula Length of feed: 15 min  LATCH Score/Interventions Latch: Repeated attempts needed to sustain latch, nipple held in mouth throughout feeding, stimulation needed to elicit sucking reflex. Intervention(s): Adjust position;Assist with latch;Breast massage;Breast compression  Audible Swallowing: None Intervention(s): Skin to skin;Hand expression Intervention(s): Hand expression;Alternate breast massage  Type of Nipple: Everted at rest and after stimulation  Comfort (Breast/Nipple): Filling, red/small blisters or bruises, mild/mod discomfort  Problem noted: Mild/Moderate discomfort Interventions (Mild/moderate discomfort): Post-pump  Hold (Positioning): Assistance needed to correctly position infant at breast and maintain latch. Intervention(s): Support Pillows;Position options;Skin to skin  LATCH Score: 5  Lactation Tools  Discussed/Used Tools: Shells;Nipple Shields;Pump;Comfort gels Nipple shield size: 20 Shell Type: Inverted Breast pump type: Manual Pump Review: Setup, frequency, and cleaning;Milk Storage Initiated by:: Allayne Stack RN Date initiated:: 03/07/15   Consult Status Consult Status: Follow-up Date: 03/07/15 (in pm) Follow-up type: In-patient    Alister Staver, Elta [place] 03/07/2015, 7:00 AM

## 2015-03-07 NOTE — Discharge Summary (Signed)
Obstetric Discharge Summary Reason for Admission: onset of labor Prenatal Procedures: ultrasound Intrapartum Procedures: spontaneous vaginal delivery Postpartum Procedures: none Complications-Operative and Postpartum: none HEMOGLOBIN  Date Value Ref Range Status  03/05/2015 10.3* 12.0 - 15.0 g/dL Final   HCT  Date Value Ref Range Status  03/05/2015 31.4* 36.0 - 46.0 % Final   HEMATOCRIT  Date Value Ref Range Status  12/16/2014 35.7 34.0 - 46.6 % Final    Physical Exam:  General: alert, cooperative, appears stated age and no distress Lochia: appropriate Uterine Fundus: firm Incision: n/a DVT Evaluation: Negative Homan's sign. No cords or calf tenderness. No significant calf/ankle edema.  Discharge Diagnoses: Term Pregnancy-delivered  Discharge Information: Date: 03/07/2015 Activity: pelvic rest Diet: routine Medications: PNV, Ibuprofen and POP Condition: stable and improved Instructions: refer to practice specific booklet Discharge to: home   Newborn Data: Live born female  Birth Weight: 8 lb 3 oz (3715 g) APGAR: 8, 8  Home with mother.  Cheryl Bowman, Cheryl Bowman 03/07/2015, 7:10 AM

## 2015-03-09 ENCOUNTER — Encounter: Payer: BLUE CROSS/BLUE SHIELD | Admitting: Women's Health

## 2015-03-09 LAB — TYPE AND SCREEN
ABO/RH(D): B NEG
ANTIBODY SCREEN: POSITIVE
DAT, IgG: NEGATIVE
UNIT DIVISION: 0
Unit division: 0

## 2015-03-17 DIAGNOSIS — Z029 Encounter for administrative examinations, unspecified: Secondary | ICD-10-CM

## 2015-03-29 ENCOUNTER — Ambulatory Visit (INDEPENDENT_AMBULATORY_CARE_PROVIDER_SITE_OTHER): Payer: BLUE CROSS/BLUE SHIELD | Admitting: Obstetrics & Gynecology

## 2015-03-29 ENCOUNTER — Encounter: Payer: Self-pay | Admitting: Obstetrics & Gynecology

## 2015-03-29 VITALS — BP 110/70 | HR 76 | Wt 225.4 lb

## 2015-03-29 DIAGNOSIS — O8883 Other embolism in the puerperium: Secondary | ICD-10-CM | POA: Diagnosis not present

## 2015-03-29 DIAGNOSIS — O8823 Thromboembolism in the puerperium: Secondary | ICD-10-CM | POA: Insufficient documentation

## 2015-03-29 NOTE — Progress Notes (Signed)
Patient ID: Cheryl Bowman, female   DOB: 1986/04/16, 29 y.o.   MRN: [id number] Chief Complaint  Patient presents with  . Follow-up    hospital.    Blood pressure 110/70, pulse 76, weight 225 lb 6.4 oz (102.241 kg), unknown if currently breastfeeding.  Subjective Pt delivered on 7/29 discharge 2 days later doing well Experienced chest pain and SOB for 1 week priot to this past Saturday went to [facility] and then to [facility] ER and found to have a pulmonary embolus No personal or family history  Given lovenox/xarelto and discharge home next day (no records: not on EPIC, pt unsure of thrombophilia panel being done)  Objective   Pertinent ROS No burning with urination, frequency or urgency No nausea, vomiting or diarrhea Nor fever chills or other constitutional symptoms   Labs or studies At [facility]  Impression New Diagnosis: Postpartum pulmonary embolism Thrombophilia status unknown, 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  The summary and outline of the counseling and care coordination is summarized in the note above.   All questions were answered.

## 2015-04-18 ENCOUNTER — Encounter: Payer: Self-pay | Admitting: Women's Health

## 2015-04-18 ENCOUNTER — Ambulatory Visit (INDEPENDENT_AMBULATORY_CARE_PROVIDER_SITE_OTHER): Payer: BLUE CROSS/BLUE SHIELD | Admitting: Women's Health

## 2015-04-18 DIAGNOSIS — O8823 Thromboembolism in the puerperium: Secondary | ICD-10-CM

## 2015-04-18 DIAGNOSIS — O8883 Other embolism in the puerperium: Secondary | ICD-10-CM

## 2015-04-18 NOTE — Progress Notes (Signed)
Patient ID: Cheryl Bowman, female   DOB: July 25, 1986, 29 y.o.   MRN: [id number] Subjective:    Cheryl Bowman is a 29 y.o. G18P3003 Caucasian female who presents for a postpartum visit. She is 27 weeks postpartum following a spontaneous vaginal delivery at 39.4 gestational weeks. Anesthesia: epidural. I have fully reviewed the prenatal and intrapartum course. Postpartum course has been complicated by pp pulmonary embolism dx @ 3wks pp, but pt states she began having sx of sob/cp @ 2wks pp. Went to PCP [facility] then sent to [facility] ED- given Lovenox then Xarelto. Losing pregnancy [place] soon, won't be able to afford Xarelto- will be $129 out of pocket, just got refill last week, seems very frustrated w/ having to take. Baby's course has been uncomplicated. Baby is feeding by bottle. Bleeding no bleeding. Bowel function is normal. Bladder function is normal. Patient is sexually active. Last sexual activity: 9/9. Contraception method is begam taking POPs Sunday 04/17/15. Postpartum depression screening: negative. Score 9, on celexa 40mg  daily- feels like she is doing well.  Last pap 2015 in [place] and was neg.  The following portions of the patient's history were reviewed and updated as appropriate: allergies, current medications, past medical history, past surgical history and problem list.  Review of Systems Pertinent items are noted in HPI.   Filed Vitals:   04/18/15 1103  BP: 110/70  Pulse: 80  Weight: 231 lb (104.781 kg)   No LMP recorded.  Objective:   General:  alert, cooperative and no distress   Breasts:  deferred, no complaints  Lungs: clear to auscultation bilaterally  Heart:  regular rate and rhythm  Abdomen: soft, nontender   Vulva: normal  Vagina: normal vagina  Cervix:  closed  Corpus: Well-involuted  Adnexa:  Non-palpable  Rectal Exam: No hemorrhoids        Assessment:   Postpartum exam 6 wks s/p SVB PP Pulmonary Embolism dx @ 3wks, on Xarelto  now Bottlefeeding Depression screening Contraception counseling   Plan:  Discussed switching Xarelto to Coumadin w/ JVF d/t cost, he recommended calling [facility] to set up- left message w/ them. Also called pt's PCP [facility], got her an appt tomorrow at 11:00 to discuss switching/closer coumadin clinic alternative as she lives in [place].  Contraception: oral progesterone-only contraceptive, has already begun taking- reiterated importance of taking at exact same time daily to help prevent pregnancy Follow up in: 1 year for physical or earlier if needed  Tawnya Crook CNM, [facility] 04/18/2015 11:26 AM

## 2015-04-18 NOTE — Patient Instructions (Signed)
Appointment with [facility] 9/13 @ 11:00am to discuss your medication

## 2016-07-26 ENCOUNTER — Ambulatory Visit: Payer: BLUE CROSS/BLUE SHIELD | Admitting: Women's Health

## 2016-08-02 ENCOUNTER — Ambulatory Visit (INDEPENDENT_AMBULATORY_CARE_PROVIDER_SITE_OTHER): Payer: BLUE CROSS/BLUE SHIELD | Admitting: Women's Health

## 2016-08-02 ENCOUNTER — Encounter: Payer: Self-pay | Admitting: Women's Health

## 2016-08-02 VITALS — BP 156/64 | HR 114 | Ht 63.0 in | Wt 215.0 lb

## 2016-08-02 DIAGNOSIS — O8883 Other embolism in the puerperium: Secondary | ICD-10-CM

## 2016-08-02 DIAGNOSIS — R112 Nausea with vomiting, unspecified: Secondary | ICD-10-CM | POA: Diagnosis not present

## 2016-08-02 DIAGNOSIS — L4050 Arthropathic psoriasis, unspecified: Secondary | ICD-10-CM | POA: Diagnosis not present

## 2016-08-02 DIAGNOSIS — F418 Other specified anxiety disorders: Secondary | ICD-10-CM

## 2016-08-02 DIAGNOSIS — N926 Irregular menstruation, unspecified: Secondary | ICD-10-CM | POA: Diagnosis not present

## 2016-08-02 DIAGNOSIS — Z8759 Personal history of other complications of pregnancy, childbirth and the puerperium: Secondary | ICD-10-CM

## 2016-08-02 DIAGNOSIS — O8823 Thromboembolism in the puerperium: Secondary | ICD-10-CM

## 2016-08-02 DIAGNOSIS — Z3490 Encounter for supervision of normal pregnancy, unspecified, unspecified trimester: Secondary | ICD-10-CM

## 2016-08-02 DIAGNOSIS — O3680X0 Pregnancy with inconclusive fetal viability, not applicable or unspecified: Secondary | ICD-10-CM

## 2016-08-02 DIAGNOSIS — Z3480 Encounter for supervision of other normal pregnancy, unspecified trimester: Secondary | ICD-10-CM | POA: Insufficient documentation

## 2016-08-02 DIAGNOSIS — O26899 Other specified pregnancy related conditions, unspecified trimester: Secondary | ICD-10-CM

## 2016-08-02 DIAGNOSIS — Z3201 Encounter for pregnancy test, result positive: Secondary | ICD-10-CM

## 2016-08-02 DIAGNOSIS — Z6791 Unspecified blood type, Rh negative: Secondary | ICD-10-CM

## 2016-08-02 LAB — POCT URINE PREGNANCY: Preg Test, Ur: POSITIVE — AB

## 2016-08-02 MED ORDER — PRENATAL PLUS 27-1 MG PO TABS: 1.0000 | ORAL_TABLET | Freq: Every day | ORAL | 11 refills | Status: DC

## 2016-08-02 MED ORDER — DOXYLAMINE-PYRIDOXINE 10-10 MG PO TBEC: DELAYED_RELEASE_TABLET | ORAL | 6 refills | Status: DC

## 2016-08-02 MED ORDER — ENOXAPARIN SODIUM 100 MG/ML ~~LOC~~ SOLN: 100.0000 mg | Freq: Every day | SUBCUTANEOUS | 6 refills | Status: DC

## 2016-08-02 NOTE — Patient Instructions (Signed)

## 2016-08-02 NOTE — Progress Notes (Signed)
   [facility] Visit  Patient name: Cheryl Bowman MRN [id number]  Date of birth: Apr 01, 1986  CC & HPI:  Cheryl Bowman is a 30 y.o. G62P3003 Caucasian female presenting today for report of +HPT x 3. +n/v, requests rx for diclegis. Pregnancy not intended, however she was not on any contraception. Just started Humira injections last week for psoriatic arthritis. On cymbalta for depression-working well. H/O pp PE last pregnancy, was on Xarelto x 2mths pp- stopped in Feb. Unsure if she had thrombophilia panel. LMP 11/6 but was only brown spotting, last normal period 10/12. Denies h/o HTN outside of pregnancy, has had GHTN/labile bp's at term x 2. BP at pp visit was normal. States it hasn't been checked since.   Pertinent History Reviewed:  Medical & Surgical Hx:   Past medical, surgical, family, and social history reviewed in electronic medical record Medications: Reviewed & Updated - see associated section Allergies: Reviewed in electronic medical record  Objective Findings:  Vitals: BP (!) 156/64 (BP Location: Left Arm, Patient Position: Sitting, Cuff Size: Large)   Pulse (!) 114   Ht 5\' 3"  (1.6 m)   Wt 215 lb (97.5 kg)   LMP 06/11/2016   BMI 38.09 kg/m  Body mass index is 38.09 kg/m.  Physical Examination: General appearance - alert, well appearing, and in no distress Unable to hear FHT w/ doppler  Results for orders placed or performed in visit on 08/02/16 (from the past 24 hour(s))  POCT urine pregnancy   Collection Time: 08/02/16  1:41 PM  Result Value Ref Range   Preg Test, Ur Positive (A) Negative     Assessment & Plan:  A:   ~7-[redacted]wks pregnant  N/V of pregnancy  Psoriatic arthritis  Depression  H/O pp PE, off of anticoagulation x 78mths  Elevated bp  H/O GHTN  P:  Rx diclegis per request, if has problems w/ insurance covering, to let us know and will give instructions for otc vit b6/unisom, or can give rx for phenergan if she prefers  Discussed w/ LHE,  needs to stop Humira, Rx Lovenox 100mg  daily- if has any questions about administration let us know  Continue cymbalta  Rx pnv, begin asap  Get records from [facility] to see if thrombophilia panel was done on 2016 admit  Return in about 1 week (around 08/09/2016) for dating u/s and ob intake w/ tish., BP check  Tawnya Crook CNM, [facility] 08/02/2016 2:15 PM

## 2016-08-06 NOTE — L&D Delivery Note (Signed)
Cheryl Bowman is a 31 year old female [phone] with IUP at [redacted]w[redacted]d by LMP/9wk sono that presented for premature rupture of membranes at 7pm.  Patient pushed for approximately 10 minutes before delivering a vigorous baby boy.  Cord was clamped and cut after one minute.  Patient spontaneously passed the placenta 4 minutes later, intact.  There were no lacerations noted immediately post partum and the uterine fundus was firm to palpation.  Delivery Note At 3:44 AM a viable female was delivered via Vaginal, Spontaneous Delivery (Presentation: ROA).  APGAR: 9, 9; weight pending .   Placenta status: intact.  Cord: 3v with the following complications: None.  Cord pH: Not obtained  Anesthesia: None Episiotomy: None Lacerations:  None Suture Repair: Not needed Est. Blood Loss (mL): 200  Mom to postpartum.  Baby to Couplet care / Skin to Skin.  Aneta Mins 03/03/2017, 4:01 AM  Delivery occurred under the supervision of Dr. Mora Bellman, MD.

## 2016-08-07 ENCOUNTER — Encounter: Payer: Self-pay | Admitting: *Deleted

## 2016-08-09 ENCOUNTER — Other Ambulatory Visit: Payer: BLUE CROSS/BLUE SHIELD

## 2016-08-15 ENCOUNTER — Ambulatory Visit (INDEPENDENT_AMBULATORY_CARE_PROVIDER_SITE_OTHER): Payer: BLUE CROSS/BLUE SHIELD

## 2016-08-15 ENCOUNTER — Encounter: Payer: Self-pay | Admitting: *Deleted

## 2016-08-15 ENCOUNTER — Ambulatory Visit (INDEPENDENT_AMBULATORY_CARE_PROVIDER_SITE_OTHER): Payer: BLUE CROSS/BLUE SHIELD | Admitting: *Deleted

## 2016-08-15 VITALS — BP 134/86 | Wt 207.0 lb

## 2016-08-15 DIAGNOSIS — O3491 Maternal care for abnormality of pelvic organ, unspecified, first trimester: Secondary | ICD-10-CM

## 2016-08-15 DIAGNOSIS — Z3481 Encounter for supervision of other normal pregnancy, first trimester: Secondary | ICD-10-CM | POA: Diagnosis not present

## 2016-08-15 DIAGNOSIS — F17200 Nicotine dependence, unspecified, uncomplicated: Secondary | ICD-10-CM

## 2016-08-15 DIAGNOSIS — Z331 Pregnant state, incidental: Secondary | ICD-10-CM | POA: Diagnosis not present

## 2016-08-15 DIAGNOSIS — Z1389 Encounter for screening for other disorder: Secondary | ICD-10-CM | POA: Diagnosis not present

## 2016-08-15 DIAGNOSIS — Z3A09 9 weeks gestation of pregnancy: Secondary | ICD-10-CM

## 2016-08-15 DIAGNOSIS — O99331 Smoking (tobacco) complicating pregnancy, first trimester: Secondary | ICD-10-CM | POA: Diagnosis not present

## 2016-08-15 DIAGNOSIS — O3680X0 Pregnancy with inconclusive fetal viability, not applicable or unspecified: Secondary | ICD-10-CM | POA: Diagnosis not present

## 2016-08-15 DIAGNOSIS — Z3682 Encounter for antenatal screening for nuchal translucency: Secondary | ICD-10-CM

## 2016-08-15 LAB — POCT URINALYSIS DIPSTICK
Blood, UA: NEGATIVE
GLUCOSE UA: NEGATIVE
KETONES UA: NEGATIVE
Leukocytes, UA: NEGATIVE
Nitrite, UA: NEGATIVE
Protein, UA: NEGATIVE

## 2016-08-15 MED ORDER — DULOXETINE HCL 30 MG PO CPEP: 30.0000 mg | ORAL_CAPSULE | Freq: Every day | ORAL | 0 refills | Status: DC

## 2016-08-15 NOTE — Progress Notes (Signed)
[place] 9+2 wks,single IUP, pos fht 176 bpm,crl 28.6 mm,normal left ovary,simple right corpus luteal cyst 2.8 x 2.3 x 1.9 cm,EDD 03/18/2017 by LMP

## 2016-08-15 NOTE — Progress Notes (Signed)
Cheryl Bowman is a 31 y.o. [phone] female here today for initial OB intake/educational visit with RN  Patient's medical, surgical, and obstetrical history obtained and reviewed.  Current medications and allergies also reviewed.   Dating ultrasound today revealed GA of [redacted]wk2d based on LMP. EDC 03/18/17   BP 134/86   Wt 207 lb (93.9 kg)   LMP 06/11/2016   BMI 36.67 kg/m   Patient Active Problem List   Diagnosis Date Noted  . History of gestational hypertension 08/02/2016  . Psoriatic arthritis ([place]) 08/02/2016  . Pregnant 08/02/2016  . Obstetrical pulmonary embolism, postpartum 03/29/2015  . Rh negative state in antepartum period 08/31/2014  . Depression with anxiety 08/30/2014   Past Medical History:  Diagnosis Date  . Anxiety   . Bronchitis   . Depression   . GERD (gastroesophageal reflux disease)   . Plantar fasciitis   . Psoriasis   . Pulmonary embolism ([place])   . Sinusitis, acute    Past Surgical History:  Procedure Laterality Date  . APPENDECTOMY    . WISDOM TOOTH EXTRACTION     OB History    Gravida Para Term Preterm AB Living   4 3 3     3    SAB TAB Ectopic Multiple Live Births         0 3      She is not taking prenatal vitamins. States they make her sick. PN1 labs drawn  Reviewed recommended weight gain based on pre-gravid BMI Patient currently smokes 0.5 ppd but very adament about not quitting. Discussed risks of smoking.  When asked to complete the PHQ-9 screening pt broke down crying stating "she just couldn't" and wanted to go. I asked patient if everything was ok and she said "no". She had not been on her Cymbalta in 2 months and needed it. I informed patient I would get provider in to see her. Cheryl Bowman asked to see pt. Genetic Screening discussed First Screen: requested Cystic fibrosis screening discussed declined  Face-to-face time at least 30 minutes. 50% or more of this visit was spent in counseling and coordination of care.  Return in about 3 weeks  (around 09/05/2016) for US;NT+1stIT, New OB.   Cheryl Bowman 08/15/2016 2:30 PM  Pt was on Cymbalta, had to quit 2 months ago d/t cost.  Now it's generic and her insurance will cover it.  States that it helped her better than any other med.  Refuses counseling  "I don't even know what I would talk about."  Restart Cymbalta at 30mg /day w/plans to increase to 60mg /day in a few weeks. Aware that it is a pregnancy category C medication.  Cheryl Bowman,Cheryl Bowman

## 2016-08-16 LAB — URINALYSIS, ROUTINE W REFLEX MICROSCOPIC
Bilirubin, UA: NEGATIVE
Glucose, UA: NEGATIVE
KETONES UA: NEGATIVE
Leukocytes, UA: NEGATIVE
NITRITE UA: NEGATIVE
Protein, UA: NEGATIVE
RBC UA: NEGATIVE
SPEC GRAV UA: 1.024 (ref 1.005–1.030)
Urobilinogen, Ur: 1 mg/dL (ref 0.2–1.0)
pH, UA: 7.5 (ref 5.0–7.5)

## 2016-08-16 LAB — PMP SCREEN PROFILE (10S), URINE
AMPHETAMINE SCRN UR: NEGATIVE ng/mL
BENZODIAZEPINE SCREEN, URINE: NEGATIVE ng/mL
Barbiturate Screen, Ur: NEGATIVE ng/mL
COCAINE(METAB.) SCREEN, URINE: NEGATIVE ng/mL
Cannabinoids Ur Ql Scn: NEGATIVE ng/mL
Creatinine(Crt), U: 124.3 mg/dL (ref 20.0–300.0)
Methadone Scn, Ur: NEGATIVE ng/mL
OXYCODONE+OXYMORPHONE UR QL SCN: NEGATIVE ng/mL
Opiate Scrn, Ur: NEGATIVE ng/mL
PCP SCRN UR: NEGATIVE ng/mL
PH UR, DRUG SCRN: 7.2 (ref 4.5–8.9)
Propoxyphene, Screen: NEGATIVE ng/mL

## 2016-08-16 LAB — RUBELLA SCREEN: RUBELLA: 5.04 {index} (ref >0.99)

## 2016-08-16 LAB — CBC
HEMATOCRIT: 39.2 % (ref 34.0–46.6)
HEMOGLOBIN: 13.4 g/dL (ref 11.1–15.9)
MCH: 27.8 pg (ref 26.6–33.0)
MCHC: 34.2 g/dL (ref 31.5–35.7)
MCV: 81 fL (ref 79–97)
Platelets: 202 10*3/uL (ref 150–379)
RBC: 4.82 x10E6/uL (ref 3.77–5.28)
RDW: 14.4 % (ref 12.3–15.4)
WBC: 3 10*3/uL — ABNORMAL LOW (ref 3.4–10.8)

## 2016-08-16 LAB — ABO/RH: Rh Factor: NEGATIVE

## 2016-08-16 LAB — VARICELLA ZOSTER ANTIBODY, IGG: VARICELLA: 3313 {index} (ref >165)

## 2016-08-16 LAB — GC/CHLAMYDIA PROBE AMP
Chlamydia trachomatis, NAA: NEGATIVE
Neisseria gonorrhoeae by PCR: NEGATIVE

## 2016-08-16 LAB — HEPATITIS B SURFACE ANTIGEN: Hepatitis B Surface Ag: NEGATIVE

## 2016-08-16 LAB — RPR: RPR: NONREACTIVE

## 2016-08-16 LAB — ANTIBODY SCREEN: Antibody Screen: NEGATIVE

## 2016-08-16 LAB — HIV ANTIBODY (ROUTINE TESTING W REFLEX): HIV Screen 4th Generation wRfx: NONREACTIVE

## 2016-08-17 LAB — URINE CULTURE

## 2016-09-06 ENCOUNTER — Ambulatory Visit (INDEPENDENT_AMBULATORY_CARE_PROVIDER_SITE_OTHER): Payer: BLUE CROSS/BLUE SHIELD

## 2016-09-06 ENCOUNTER — Encounter: Payer: BLUE CROSS/BLUE SHIELD | Admitting: Women's Health

## 2016-09-06 ENCOUNTER — Other Ambulatory Visit: Payer: BLUE CROSS/BLUE SHIELD

## 2016-09-06 DIAGNOSIS — Z3481 Encounter for supervision of other normal pregnancy, first trimester: Secondary | ICD-10-CM

## 2016-09-06 DIAGNOSIS — Z3682 Encounter for antenatal screening for nuchal translucency: Secondary | ICD-10-CM

## 2016-09-06 DIAGNOSIS — Z3A12 12 weeks gestation of pregnancy: Secondary | ICD-10-CM

## 2016-09-06 NOTE — Progress Notes (Signed)
[place] 12+3 wks,measurements c/w dates,NB present,NT 1.8 mm,normal ov's bilat,crl 67.4 mm

## 2016-09-13 LAB — MATERNAL SCREEN, INTEGRATED #1
Crown Rump Length: 67.4 mm
Gest. Age on Collection Date: 12.9 weeks
Maternal Age at EDD: 29.5 years
Nuchal Translucency (NT): 1.8 mm
Number of Fetuses: 1
PAPP-A Value: 763.9 ng/mL
Weight: 200 [lb_av]

## 2016-09-20 ENCOUNTER — Other Ambulatory Visit (HOSPITAL_COMMUNITY)
Admission: RE | Admit: 2016-09-20 | Discharge: 2016-09-20 | Disposition: A | Discharge status: Home / Self Care | Payer: BLUE CROSS/BLUE SHIELD | Source: Ambulatory Visit | Attending: Obstetrics & Gynecology | Admitting: Obstetrics & Gynecology

## 2016-09-20 ENCOUNTER — Ambulatory Visit (INDEPENDENT_AMBULATORY_CARE_PROVIDER_SITE_OTHER): Payer: BLUE CROSS/BLUE SHIELD | Admitting: Women's Health

## 2016-09-20 ENCOUNTER — Encounter: Payer: Self-pay | Admitting: Women's Health

## 2016-09-20 VITALS — BP 138/88 | HR 82 | Wt 196.0 lb

## 2016-09-20 DIAGNOSIS — Z3A14 14 weeks gestation of pregnancy: Secondary | ICD-10-CM

## 2016-09-20 DIAGNOSIS — Z3482 Encounter for supervision of other normal pregnancy, second trimester: Secondary | ICD-10-CM

## 2016-09-20 DIAGNOSIS — O0991 Supervision of high risk pregnancy, unspecified, first trimester: Secondary | ICD-10-CM

## 2016-09-20 DIAGNOSIS — Z1389 Encounter for screening for other disorder: Secondary | ICD-10-CM

## 2016-09-20 DIAGNOSIS — Z124 Encounter for screening for malignant neoplasm of cervix: Secondary | ICD-10-CM

## 2016-09-20 DIAGNOSIS — O99341 Other mental disorders complicating pregnancy, first trimester: Secondary | ICD-10-CM

## 2016-09-20 DIAGNOSIS — Z01419 Encounter for gynecological examination (general) (routine) without abnormal findings: Secondary | ICD-10-CM | POA: Diagnosis present

## 2016-09-20 DIAGNOSIS — O131 Gestational [pregnancy-induced] hypertension without significant proteinuria, first trimester: Secondary | ICD-10-CM

## 2016-09-20 DIAGNOSIS — O99331 Smoking (tobacco) complicating pregnancy, first trimester: Secondary | ICD-10-CM

## 2016-09-20 DIAGNOSIS — Z331 Pregnant state, incidental: Secondary | ICD-10-CM

## 2016-09-20 LAB — POCT URINALYSIS DIPSTICK
GLUCOSE UA: NEGATIVE
KETONES UA: NEGATIVE
Leukocytes, UA: NEGATIVE
NITRITE UA: NEGATIVE
RBC UA: NEGATIVE

## 2016-09-20 NOTE — Progress Notes (Signed)
Subjective:  Cheryl Bowman is a 31 y.o. G98P3003 Caucasian female at [redacted]w[redacted]d by LMP c/w 9wk u/s, being seen today for her first obstetrical visit.  Had intake visit w/ Tish 08/15/16. Her obstetrical history is significant for smoker- not interested in quitting, depression- on cymbalta and states she is doing well, h/o PE 3wks after last baby- currently on Lovenox, term svb x 3, h/o GHTN, psoriatic arthritis- had just started Humira when she found out she was pregnant- has since stopped.  Pregnancy history fully reviewed.  Patient reports n/v- requests meds. 'Inclusion cyst' Lt labia since ~ 2013 feels like it's getting bigger, doesn't really hurt.  Denies vb, cramping, uti s/s, abnormal/malodorous vag d/c, or vulvovaginal itching/irritation.  BP 138/88   Pulse 82   Wt 196 lb (88.9 kg)   LMP 06/11/2016   BMI 34.72 kg/m   HISTORY: OB History  Gravida Para Term Preterm AB Living  4 3 3     3   SAB TAB Ectopic Multiple Live Births        0 3    # Outcome Date GA Lbr Len/2nd Weight Sex Delivery Anes PTL Lv  4 Current           3 Term 03/05/15 [redacted]w[redacted]d 09:51 / 00:03 8 lb 3 oz (3.715 kg) F Vag-Spont EPI N LIV     Complications: Postpartum pulmonary embolism  2 Term 12/06/13 [redacted]w[redacted]d  6 lb 4 oz (2.835 kg) M Vag-Spont EPI N LIV  1 Term 05/24/06 [redacted]w[redacted]d  8 lb 4 oz (3.742 kg) F Vag-Spont EPI N LIV     Past Medical History:  Diagnosis Date  . Anxiety   . Bronchitis   . Depression   . GERD (gastroesophageal reflux disease)   . Plantar fasciitis   . Psoriasis   . Pulmonary embolism ([place])   . Sinusitis, acute    Past Surgical History:  Procedure Laterality Date  . APPENDECTOMY    . WISDOM TOOTH EXTRACTION     Family History  Problem Relation Age of Onset  . Alcohol abuse Mother   . Hypertension Father   . Depression Father   . Alcohol abuse Father   . Hyperlipidemia Father   . Stroke Father   . Cancer Maternal Grandmother     lung  . Depression Paternal Grandmother   . Kidney disease  Paternal Grandfather   . Heart disease Paternal Grandfather   . Heart murmur Son     Exam   System:     General: Well developed & nourished, no acute distress   Skin: Warm & dry, normal coloration and turgor, no rashes   Neurologic: Alert & oriented, normal mood   Cardiovascular: Regular rate & rhythm   Respiratory: Effort & rate normal, LCTAB, acyanotic   Abdomen: Soft, non tender   Extremities: normal strength, tone   Pelvic Exam:    Perineum: Lt labia majora~1cm raised round cyst purplish in color w/ smaller white cyst on top, not tender   Vulva: Normal, no lesions   Vagina:  Normal mucosa, normal discharge   Cervix: Normal, bulbous, appears closed   Uterus: Normal size/shape/contour for GA   Thin prep pap smear obtained today  FHR: 160 via doppler  Depression screen [facility] 2/9 09/20/2016  Decreased Interest 0  Down, Depressed, Hopeless 0  PHQ - 2 Score 0  Altered sleeping 0  Tired, decreased energy 0  Change in appetite 0  Feeling bad or failure about yourself  0  Trouble concentrating 0  Moving slowly or fidgety/restless 0  Suicidal thoughts 0  PHQ-9 Score 0      Assessment:   Pregnancy: [id number] Patient Active Problem List   Diagnosis Date Noted  . Rh negative state in antepartum period 08/31/2014    Priority: High  . Depression with anxiety 08/30/2014    Priority: High  . Smoker 08/15/2016  . History of gestational hypertension 08/02/2016  . Psoriatic arthritis ([place]) 08/02/2016  . Encounter for supervision of other normal pregnancy 08/02/2016  . Obstetrical pulmonary embolism, postpartum 03/29/2015    [redacted]w[redacted]d [id number] New OB visit Lt labial cyst Smoker H/O GHTN H/O postpartum PE, on Lovenox Depression   Plan:  Initial labs obtained at intake visit Continue prenatal vitamins Problem list reviewed and updated Reviewed n/v relief measures and warning s/s to report- gave printed otc unisom/vit b6 instructions, if not helping, can call for zofran rx  (states phenergan makes it worse) Reviewed recommended weight gain based on pre-gravid BMI Encouraged well-balanced diet Genetic Screening discussed Integrated Screen: had 1st IT/NT 2/1 Cystic fibrosis screening discussed declined Ultrasound discussed; fetal survey: requested Follow up in 3 weeks for visit w/ MD to further assess labial cyst (not here today) and 2nd IT Continue Lovenox and cymbalta Advised smoking cessation, not interested in quitting at this time [place] completed  Tawnya Crook CNM, [facility] [address] 09/20/2016 1:08 PM

## 2016-09-20 NOTE — Patient Instructions (Signed)
Vitamin B6 (pyridoxine) 25mg  four times a day OR 50mg  twice a day Add Unisom (doxylamine) 12.5mg  (1/2 tab) in the morning, 12.5mg  6-8 hours later, then 25mg  every night if symptoms do not improve with Vitamin B6 alone    Nausea & Vomiting  Have saltine crackers or pretzels by your bed and eat a few bites before you raise your head out of bed in the morning  Eat small frequent meals throughout the day instead of large meals  Drink plenty of fluids throughout the day to stay hydrated, just don't drink a lot of fluids with your meals.  This can make your stomach fill up faster making you feel sick  Do not brush your teeth right after you eat  Products with real ginger are good for nausea, like ginger ale and ginger hard candy Make sure it says made with real ginger!  Sucking on sour candy like lemon heads is also good for nausea  If your prenatal vitamins make you nauseated, take them at night so you will sleep through the nausea  Sea Bands  If you feel like you need medicine for the nausea & vomiting please let us know  If you are unable to keep any fluids or food down please let us know   Constipation  Drink plenty of fluid, preferably water, throughout the day  Eat foods high in fiber such as fruits, vegetables, and grains  Exercise, such as walking, is a good way to keep your bowels regular  Drink warm fluids, especially warm prune juice, or decaf coffee  Eat a 1/2 cup of real oatmeal (not instant), 1/2 cup applesauce, and 1/2-1 cup warm prune juice every day  If needed, you may take Colace (docusate sodium) stool softener once or twice a day to help keep the stool soft. If you are pregnant, wait until you are out of your first trimester (12-14 weeks of pregnancy)  If you still are having problems with constipation, you may take Miralax once daily as needed to help keep your bowels regular.  If you are pregnant, wait until you are out of your first trimester (12-14 weeks of  pregnancy)  Second Trimester of Pregnancy The second trimester is from week 13 through week 28 (months 4 through 6). The second trimester is often a time when you feel your best. Your body has also adjusted to being pregnant, and you begin to feel better physically. Usually, morning sickness has lessened or quit completely, you may have more energy, and you may have an increase in appetite. The second trimester is also a time when the fetus is growing rapidly. At the end of the sixth month, the fetus is about 9 inches long and weighs about 1 pounds. You will likely begin to feel the baby move (quickening) between 18 and 20 weeks of the pregnancy. Body changes during your second trimester Your body continues to go through many changes during your second trimester. The changes vary from woman to woman.  Your weight will continue to increase. You will notice your lower abdomen bulging out.  You may begin to get stretch marks on your hips, abdomen, and breasts.  You may develop headaches that can be relieved by medicines. The medicines should be approved by your health care provider.  You may urinate more often because the fetus is pressing on your bladder.  You may develop or continue to have heartburn as a result of your pregnancy.  You may develop constipation because certain hormones are  causing the muscles that push waste through your intestines to slow down.  You may develop hemorrhoids or swollen, bulging veins (varicose veins).  You may have back pain. This is caused by:  Weight gain.  Pregnancy hormones that are relaxing the joints in your pelvis.  A shift in weight and the muscles that support your balance.  Your breasts will continue to grow and they will continue to become tender.  Your gums may bleed and may be sensitive to brushing and flossing.  Dark spots or blotches (chloasma, mask of pregnancy) may develop on your face. This will likely fade after the baby is  born.  A dark line from your belly button to the pubic area (linea nigra) may appear. This will likely fade after the baby is born.  You may have changes in your hair. These can include thickening of your hair, rapid growth, and changes in texture. Some women also have hair loss during or after pregnancy, or hair that feels dry or thin. Your hair will most likely return to normal after your baby is born. What to expect at prenatal visits During a routine prenatal visit:  You will be weighed to make sure you and the fetus are growing normally.  Your blood pressure will be taken.  Your abdomen will be measured to track your baby's growth.  The fetal heartbeat will be listened to.  Any test results from the previous visit will be discussed. Your health care provider may ask you:  How you are feeling.  If you are feeling the baby move.  If you have had any abnormal symptoms, such as leaking fluid, bleeding, severe headaches, or abdominal cramping.  If you are using any tobacco products, including cigarettes, chewing tobacco, and electronic cigarettes.  If you have any questions. Other tests that may be performed during your second trimester include:  Blood tests that check for:  Low iron levels (anemia).  Gestational diabetes (between 24 and 28 weeks).  Rh antibodies. This is to check for a protein on red blood cells (Rh factor).  Urine tests to check for infections, diabetes, or protein in the urine.  An ultrasound to confirm the proper growth and development of the baby.  An amniocentesis to check for possible genetic problems.  Fetal screens for spina bifida and Down syndrome.  HIV (human immunodeficiency virus) testing. Routine prenatal testing includes screening for HIV, unless you choose not to have this test. Follow these instructions at home: Eating and drinking  Continue to eat regular, healthy meals.  Avoid raw meat, uncooked cheese, cat litter boxes, and  soil used by cats. These carry germs that can cause birth defects in the baby.  Take your prenatal vitamins.  Take 1500-2000 mg of calcium daily starting at the 20th week of pregnancy until you deliver your baby.  If you develop constipation:  Take over-the-counter or prescription medicines.  Drink enough fluid to keep your urine clear or pale yellow.  Eat foods that are high in fiber, such as fresh fruits and vegetables, whole grains, and beans.  Limit foods that are high in fat and processed sugars, such as fried and sweet foods. Activity  Exercise only as directed by your health care provider. Experiencing uterine cramps is a good sign to stop exercising.  Avoid heavy lifting, wear low heel shoes, and practice good posture.  Wear your seat belt at all times when driving.  Rest with your legs elevated if you have leg cramps or low back pain.  Wear a good support bra for breast tenderness.  Do not use hot tubs, steam rooms, or saunas. Lifestyle  Avoid all smoking, herbs, alcohol, and unprescribed drugs. These chemicals affect the formation and growth of the baby.  Do not use any products that contain nicotine or tobacco, such as cigarettes and e-cigarettes. If you need help quitting, ask your health care provider.  A sexual relationship may be continued unless your health care provider directs you otherwise. General instructions  Follow your health care provider's instructions regarding medicine use. There are medicines that are either safe or unsafe to take during pregnancy.  Take warm sitz baths to soothe any pain or discomfort caused by hemorrhoids. Use hemorrhoid cream if your health care provider approves.  If you develop varicose veins, wear support hose. Elevate your feet for 15 minutes, 3-4 times a day. Limit salt in your diet.  Visit your dentist if you have not gone yet during your pregnancy. Use a soft toothbrush to brush your teeth and be gentle when you  floss.  Keep all follow-up prenatal visits as told by your health care provider. This is important. Contact a health care provider if:  You have dizziness.  You have mild pelvic cramps, pelvic pressure, or nagging pain in the abdominal area.  You have persistent nausea, vomiting, or diarrhea.  You have a bad smelling vaginal discharge.  You have pain with urination. Get help right away if:  You have a fever.  You are leaking fluid from your vagina.  You have spotting or bleeding from your vagina.  You have severe abdominal cramping or pain.  You have rapid weight gain or weight loss.  You have shortness of breath with chest pain.  You notice sudden or extreme swelling of your face, hands, ankles, feet, or legs.  You have not felt your baby move in over an hour.  You have severe headaches that do not go away with medicine.  You have vision changes. Summary  The second trimester is from week 13 through week 28 (months 4 through 6). It is also a time when the fetus is growing rapidly.  Your body goes through many changes during pregnancy. The changes vary from woman to woman.  Avoid all smoking, herbs, alcohol, and unprescribed drugs. These chemicals affect the formation and growth your baby.  Do not use any tobacco products, such as cigarettes, chewing tobacco, and e-cigarettes. If you need help quitting, ask your health care provider.  Contact your health care provider if you have any questions. Keep all prenatal visits as told by your health care provider. This is important. This information is not intended to replace advice given to you by your health care provider. Make sure you discuss any questions you have with your health care provider. Document Released: 07/17/2001 Document Revised: 12/29/2015 Document Reviewed: 09/23/2012 Elsevier Interactive Patient Education  2017 [employer].

## 2016-09-20 NOTE — Addendum Note (Signed)
Addended by: Traci Sermon A on: 09/20/2016 01:30 PM   Modules accepted: Orders

## 2016-09-21 LAB — CYTOLOGY - PAP: DIAGNOSIS: NEGATIVE

## 2016-10-11 ENCOUNTER — Encounter: Payer: BLUE CROSS/BLUE SHIELD | Admitting: Advanced Practice Midwife

## 2016-11-06 ENCOUNTER — Ambulatory Visit (INDEPENDENT_AMBULATORY_CARE_PROVIDER_SITE_OTHER): Payer: BLUE CROSS/BLUE SHIELD | Admitting: Women's Health

## 2016-11-06 ENCOUNTER — Encounter: Payer: Self-pay | Admitting: Women's Health

## 2016-11-06 VITALS — BP 130/78 | HR 100 | Wt 197.0 lb

## 2016-11-06 DIAGNOSIS — Z363 Encounter for antenatal screening for malformations: Secondary | ICD-10-CM

## 2016-11-06 DIAGNOSIS — Z3682 Encounter for antenatal screening for nuchal translucency: Secondary | ICD-10-CM

## 2016-11-06 DIAGNOSIS — Z1389 Encounter for screening for other disorder: Secondary | ICD-10-CM

## 2016-11-06 DIAGNOSIS — L723 Sebaceous cyst: Secondary | ICD-10-CM | POA: Insufficient documentation

## 2016-11-06 DIAGNOSIS — Z3482 Encounter for supervision of other normal pregnancy, second trimester: Secondary | ICD-10-CM

## 2016-11-06 DIAGNOSIS — Z331 Pregnant state, incidental: Secondary | ICD-10-CM

## 2016-11-06 LAB — POCT URINALYSIS DIPSTICK
Blood, UA: NEGATIVE
Glucose, UA: NEGATIVE
Ketones, UA: NEGATIVE
Leukocytes, UA: NEGATIVE
Nitrite, UA: NEGATIVE
PROTEIN UA: NEGATIVE

## 2016-11-06 NOTE — Progress Notes (Signed)
Low-risk OB appointment [id number] [redacted]w[redacted]d Estimated Date of Delivery: 03/18/17 BP 130/78   Pulse 100   Wt 197 lb (89.4 kg)   LMP 06/11/2016   BMI 34.90 kg/m   BP, weight, and urine reviewed.  Refer to obstetrical flow sheet for FH & FHR.  Reports good fm.  Denies regular uc's, lof, vb, or uti s/s. Hasn't been here since 14wks, was scared b/c of cyst on labia- thought we were going to take it off or tell her it was cancer.  Co-exam w/ LHE, ~1cm mobile round purplish cyst w/ white head Lt side introitus/labia- sebacceous cyst, purple appearance possibly from thinner skin. Does not bother pt, has been there for at least 12yrs, no need to remove.  Reviewed warning s/s to report, fm. Plan:  Continue routine obstetrical care  F/U in asap for anatomy u/s (no visit), then 3wks for OB appointment  2nd IT today Continue Lovenox daily for h/o PE

## 2016-11-06 NOTE — Patient Instructions (Signed)

## 2016-11-07 ENCOUNTER — Ambulatory Visit (INDEPENDENT_AMBULATORY_CARE_PROVIDER_SITE_OTHER): Payer: BLUE CROSS/BLUE SHIELD

## 2016-11-07 DIAGNOSIS — Z363 Encounter for antenatal screening for malformations: Secondary | ICD-10-CM | POA: Diagnosis not present

## 2016-11-07 DIAGNOSIS — Z3482 Encounter for supervision of other normal pregnancy, second trimester: Secondary | ICD-10-CM

## 2016-11-07 NOTE — Progress Notes (Signed)
[place] 87+1 wks,cephalic,cx 3.7 cm,ant pl gr 0,normal ovaries bilat,svp of fluid 5.3 cm,fhr 152 bpm,efw 435 g,anatomy complete,no obvious abnormalities seen

## 2016-11-08 LAB — MATERNAL SCREEN, INTEGRATED #2
AFP MOM: 0.77
Alpha-Fetoprotein: 40.9 ng/mL
Crown Rump Length: 67.4 mm
DIA MoM: 1.53
DIA Value: 324.5 pg/mL
ESTRIOL UNCONJUGATED: 1.9 ng/mL
GEST. AGE ON COLLECTION DATE: 12.9 wk
Gestational Age: 21.6 weeks
HCG VALUE: 17.8 [IU]/mL
MATERNAL AGE AT EDD: 29.5 a
NUMBER OF FETUSES: 1
Nuchal Translucency (NT): 1.8 mm
Nuchal Translucency MoM: 1.07
PAPP-A MoM: 0.95
PAPP-A Value: 763.9 ng/mL
Test Results:: NEGATIVE
WEIGHT: 197 [lb_av]
WEIGHT: 200 [lb_av]
hCG MoM: 1.04
uE3 MoM: 0.79

## 2016-11-27 ENCOUNTER — Encounter: Payer: Self-pay | Admitting: Obstetrics & Gynecology

## 2016-11-27 ENCOUNTER — Ambulatory Visit (INDEPENDENT_AMBULATORY_CARE_PROVIDER_SITE_OTHER): Payer: BLUE CROSS/BLUE SHIELD | Admitting: Obstetrics & Gynecology

## 2016-11-27 VITALS — BP 128/80 | HR 78 | Wt 197.3 lb

## 2016-11-27 DIAGNOSIS — Z331 Pregnant state, incidental: Secondary | ICD-10-CM

## 2016-11-27 DIAGNOSIS — Z1389 Encounter for screening for other disorder: Secondary | ICD-10-CM

## 2016-11-27 DIAGNOSIS — Z3482 Encounter for supervision of other normal pregnancy, second trimester: Secondary | ICD-10-CM

## 2016-11-27 LAB — POCT URINALYSIS DIPSTICK
Glucose, UA: NEGATIVE
Ketones, UA: NEGATIVE
LEUKOCYTES UA: NEGATIVE
NITRITE UA: NEGATIVE
PROTEIN UA: NEGATIVE
RBC UA: NEGATIVE

## 2016-11-27 MED ORDER — DULOXETINE HCL 30 MG PO CPEP: 30.0000 mg | ORAL_CAPSULE | Freq: Every day | ORAL | 5 refills | Status: DC

## 2016-11-27 NOTE — Progress Notes (Signed)
[id number] [redacted]w[redacted]d Estimated Date of Delivery: 03/18/17  Blood pressure 128/80, pulse 78, weight 197 lb 4.8 oz (89.5 kg), last menstrual period 06/11/2016, unknown if currently breastfeeding.   BP weight and urine results all reviewed and noted.  Please refer to the obstetrical flow sheet for the fundal height and fetal heart rate documentation:  Patient reports good fetal movement, denies any bleeding and no rupture of membranes symptoms or regular contractions. Patient is without complaints. All questions were answered.  Orders Placed This Encounter  Procedures  . POCT urinalysis dipstick    Plan:  Continued routine obstetrical care, restart cymbalta 30 mg daily, she stopped taking on her home  Return in about 4 weeks (around 12/25/2016) for Pn2, , LROB.

## 2016-12-25 ENCOUNTER — Encounter: Payer: Self-pay | Admitting: Obstetrics & Gynecology

## 2016-12-25 ENCOUNTER — Other Ambulatory Visit: Payer: BLUE CROSS/BLUE SHIELD

## 2016-12-25 ENCOUNTER — Ambulatory Visit (INDEPENDENT_AMBULATORY_CARE_PROVIDER_SITE_OTHER): Payer: BLUE CROSS/BLUE SHIELD | Admitting: Obstetrics & Gynecology

## 2016-12-25 VITALS — BP 128/80 | HR 80 | Wt 198.4 lb

## 2016-12-25 DIAGNOSIS — Z3482 Encounter for supervision of other normal pregnancy, second trimester: Secondary | ICD-10-CM

## 2016-12-25 DIAGNOSIS — Z331 Pregnant state, incidental: Secondary | ICD-10-CM

## 2016-12-25 DIAGNOSIS — Z3A28 28 weeks gestation of pregnancy: Secondary | ICD-10-CM

## 2016-12-25 DIAGNOSIS — Z86711 Personal history of pulmonary embolism: Secondary | ICD-10-CM

## 2016-12-25 DIAGNOSIS — Z1389 Encounter for screening for other disorder: Secondary | ICD-10-CM

## 2016-12-25 DIAGNOSIS — Z131 Encounter for screening for diabetes mellitus: Secondary | ICD-10-CM

## 2016-12-25 LAB — POCT URINALYSIS DIPSTICK
GLUCOSE UA: NEGATIVE
Ketones, UA: NEGATIVE
Leukocytes, UA: NEGATIVE
NITRITE UA: NEGATIVE
Protein, UA: NEGATIVE
RBC UA: NEGATIVE

## 2016-12-25 MED ORDER — OMEPRAZOLE 20 MG PO CPDR: 20.0000 mg | DELAYED_RELEASE_CAPSULE | Freq: Every day | ORAL | 6 refills | Status: DC

## 2016-12-25 NOTE — Progress Notes (Signed)
[id number] [redacted]w[redacted]d Estimated Date of Delivery: 03/18/17  Blood pressure 128/80, pulse 80, weight 198 lb 6.4 oz (90 kg), last menstrual period 06/11/2016, unknown if currently breastfeeding.   BP weight and urine results all reviewed and noted.  Please refer to the obstetrical flow sheet for the fundal height and fetal heart rate documentation:  Patient reports good fetal movement, denies any bleeding and no rupture of membranes symptoms or regular contractions. Patient is without complaints. All questions were answered.  Orders Placed This Encounter  Procedures  . POCT urinalysis dipstick    Plan:  Continued routine obstetrical care, PN2 today   Current Outpatient Prescriptions:  .  DULoxetine (CYMBALTA) 30 MG capsule, Take 1 capsule (30 mg total) by mouth daily., Disp: 30 capsule, Rfl: 5 .  enoxaparin (LOVENOX) 100 MG/ML injection, Inject 1 mL (100 mg total) into the skin daily., Disp: 30 mL, Rfl: 6 .  ibuprofen (ADVIL,MOTRIN) 600 MG tablet, Take 1 tablet (600 mg total) by mouth every 6 (six) hours., Disp: 30 tablet, Rfl: 0 .  Pediatric Multiple Vit-C-FA (FLINSTONES GUMMIES OMEGA-3 DHA PO), Take by mouth., Disp: , Rfl:  .  Doxylamine-Pyridoxine (DICLEGIS) 10-10 MG TBEC, 2 tabs q hs, if sx persist add 1 tab q am on day 3, if sx persist add 1 tab q afternoon on day 4 (Patient not taking: Reported on 12/25/2016), Disp: 100 tablet, Rfl: 6 .  esomeprazole (NEXIUM) 20 MG capsule, Take 20 mg by mouth daily as needed (indigestion). , Disp: , Rfl:  .  omeprazole (PRILOSEC) 20 MG capsule, Take 1 capsule (20 mg total) by mouth daily. 1 tablet a day, Disp: 30 capsule, Rfl: 6 .  ranitidine (ZANTAC) 75 MG tablet, Take 75 mg by mouth 2 (two) times daily., Disp: , Rfl:  .  traMADol (ULTRAM) 50 MG tablet, Take by mouth every 6 (six) hours as needed., Disp: , Rfl:    Meds ordered this encounter  Medications  . omeprazole (PRILOSEC) 20 MG capsule    Sig: Take 1 capsule (20 mg total) by mouth daily. 1 tablet  a day    Dispense:  30 capsule    Refill:  6     Return in about 3 weeks (around 01/15/2017) for LROB.

## 2016-12-26 LAB — CBC
HEMATOCRIT: 39 % (ref 34.0–46.6)
Hemoglobin: 13.3 g/dL (ref 11.1–15.9)
MCH: 28.7 pg (ref 26.6–33.0)
MCHC: 34.1 g/dL (ref 31.5–35.7)
MCV: 84 fL (ref 79–97)
Platelets: 237 10*3/uL (ref 150–379)
RBC: 4.63 x10E6/uL (ref 3.77–5.28)
RDW: 14.5 % (ref 12.3–15.4)
WBC: 8.2 10*3/uL (ref 3.4–10.8)

## 2016-12-26 LAB — GLUCOSE TOLERANCE, 2 HOURS W/ 1HR
GLUCOSE, 1 HOUR: 137 mg/dL (ref 65–179)
Glucose, 2 hour: 92 mg/dL (ref 65–152)
Glucose, Fasting: 78 mg/dL (ref 65–91)

## 2016-12-26 LAB — RPR: RPR: NONREACTIVE

## 2016-12-26 LAB — ANTIBODY SCREEN: ANTIBODY SCREEN: NEGATIVE

## 2016-12-26 LAB — HIV ANTIBODY (ROUTINE TESTING W REFLEX): HIV Screen 4th Generation wRfx: NONREACTIVE

## 2017-01-15 ENCOUNTER — Encounter: Payer: BLUE CROSS/BLUE SHIELD | Admitting: Women's Health

## 2017-02-03 DIAGNOSIS — R58 Hemorrhage, not elsewhere classified: Secondary | ICD-10-CM

## 2017-02-03 HISTORY — DX: Hemorrhage, not elsewhere classified: R58

## 2017-02-04 ENCOUNTER — Ambulatory Visit (INDEPENDENT_AMBULATORY_CARE_PROVIDER_SITE_OTHER): Payer: BLUE CROSS/BLUE SHIELD | Admitting: Women's Health

## 2017-02-04 VITALS — BP 130/80 | HR 78 | Wt 202.0 lb

## 2017-02-04 DIAGNOSIS — O26899 Other specified pregnancy related conditions, unspecified trimester: Secondary | ICD-10-CM

## 2017-02-04 DIAGNOSIS — Z331 Pregnant state, incidental: Secondary | ICD-10-CM

## 2017-02-04 DIAGNOSIS — O360130 Maternal care for anti-D [Rh] antibodies, third trimester, not applicable or unspecified: Secondary | ICD-10-CM

## 2017-02-04 DIAGNOSIS — Z6791 Unspecified blood type, Rh negative: Secondary | ICD-10-CM

## 2017-02-04 DIAGNOSIS — Z3A34 34 weeks gestation of pregnancy: Secondary | ICD-10-CM

## 2017-02-04 DIAGNOSIS — O26893 Other specified pregnancy related conditions, third trimester: Secondary | ICD-10-CM

## 2017-02-04 DIAGNOSIS — Z1389 Encounter for screening for other disorder: Secondary | ICD-10-CM

## 2017-02-04 DIAGNOSIS — O09893 Supervision of other high risk pregnancies, third trimester: Secondary | ICD-10-CM

## 2017-02-04 DIAGNOSIS — O360139 Maternal care for anti-D [Rh] antibodies, third trimester, other fetus: Secondary | ICD-10-CM

## 2017-02-04 DIAGNOSIS — Z3483 Encounter for supervision of other normal pregnancy, third trimester: Secondary | ICD-10-CM

## 2017-02-04 LAB — POCT URINALYSIS DIPSTICK
Glucose, UA: NEGATIVE
Ketones, UA: NEGATIVE
Leukocytes, UA: NEGATIVE
Nitrite, UA: NEGATIVE
RBC UA: NEGATIVE

## 2017-02-04 MED ORDER — RHO D IMMUNE GLOBULIN 1500 UNIT/2ML IJ SOSY
300.0000 ug | PREFILLED_SYRINGE | Freq: Once | INTRAMUSCULAR | Status: AC
  Administered 2017-02-04: 300 ug via INTRAMUSCULAR

## 2017-02-04 NOTE — Patient Instructions (Addendum)
Call the office [phone]) or go to [facility] if:  You begin to have strong, frequent contractions  Your water breaks.  Sometimes it is a big gush of fluid, sometimes it is just a trickle that keeps getting your panties wet or running down your legs  You have vaginal bleeding.  It is normal to have a small amount of spotting if your cervix was checked.   You don't feel your baby moving like normal.  If you don't, get you something to eat and drink and lay down and focus on feeling your baby move.  You should feel at least 10 movements in 2 hours.  If you don't, you should call the office or go to [facility].    Tdap Vaccine  It is recommended that you get the Tdap vaccine during the third trimester of EACH pregnancy to help protect your baby from getting pertussis (whooping cough)  27-36 weeks is the BEST time to do this so that you can pass the protection on to your baby. During pregnancy is better than after pregnancy, but if you are unable to get it during pregnancy it will be offered at the hospital.   You can get this vaccine at the health department or your family doctor  Everyone who will be around your baby should also be up-to-date on their vaccines. Adults (who are not pregnant) only need 1 dose of Tdap during adulthood.     Preterm Labor and Birth Information The normal length of a pregnancy is 39-41 weeks. Preterm labor is when labor starts before 37 completed weeks of pregnancy. What are the risk factors for preterm labor? Preterm labor is more likely to occur in women who:  Have certain infections during pregnancy such as a bladder infection, sexually transmitted infection, or infection inside the uterus (chorioamnionitis).  Have a shorter-than-normal cervix.  Have gone into preterm labor before.  Have had surgery on their cervix.  Are younger than age 41 or older than age 23.  Are African American.  Are pregnant with twins or multiple babies (multiple  gestation).  Take street drugs or smoke while pregnant.  Do not gain enough weight while pregnant.  Became pregnant shortly after having been pregnant.  What are the symptoms of preterm labor? Symptoms of preterm labor include:  Cramps similar to those that can happen during a menstrual period. The cramps may happen with diarrhea.  Pain in the abdomen or lower back.  Regular uterine contractions that may feel like tightening of the abdomen.  A feeling of increased pressure in the pelvis.  Increased watery or bloody mucus discharge from the vagina.  Water breaking (ruptured amniotic sac).  Why is it important to recognize signs of preterm labor? It is important to recognize signs of preterm labor because babies who are born prematurely may not be fully developed. This can put them at an increased risk for:  Long-term (chronic) heart and lung problems.  Difficulty immediately after birth with regulating body systems, including blood sugar, body temperature, heart rate, and breathing rate.  Bleeding in the brain.  Cerebral palsy.  Learning difficulties.  Death.  These risks are highest for babies who are born before 45 weeks of pregnancy. How is preterm labor treated? Treatment depends on the length of your pregnancy, your condition, and the health of your baby. It may involve:  Having a stitch (suture) placed in your cervix to prevent your cervix from opening too early (cerclage).  Taking or being given medicines,  such as: ? Hormone medicines. These may be given early in pregnancy to help support the pregnancy. ? Medicine to stop contractions. ? Medicines to help mature the baby's lungs. These may be prescribed if the risk of delivery is high. ? Medicines to prevent your baby from developing cerebral palsy.  If the labor happens before 34 weeks of pregnancy, you may need to stay in the hospital. What should I do if I think I am in preterm labor? If you think that you  are going into preterm labor, call your health care provider right away. How can I prevent preterm labor in future pregnancies? To increase your chance of having a full-term pregnancy:  Do not use any tobacco products, such as cigarettes, chewing tobacco, and e-cigarettes. If you need help quitting, ask your health care provider.  Do not use street drugs or medicines that have not been prescribed to you during your pregnancy.  Talk with your health care provider before taking any herbal supplements, even if you have been taking them regularly.  Make sure you gain a healthy amount of weight during your pregnancy.  Watch for infection. If you think that you might have an infection, get it checked right away.  Make sure to tell your health care provider if you have gone into preterm labor before.  This information is not intended to replace advice given to you by your health care provider. Make sure you discuss any questions you have with your health care provider. Document Released: 10/13/2003 Document Revised: 01/03/2016 Document Reviewed: 12/14/2015 Elsevier Interactive Patient Education  2018 [employer].   Sciatica Rehab Ask your health care provider which exercises are safe for you. Do exercises exactly as told by your health care provider and adjust them as directed. It is normal to feel mild stretching, pulling, tightness, or discomfort as you do these exercises, but you should stop right away if you feel sudden pain or your pain gets worse.Do not begin these exercises until told by your health care provider. Stretching and range of motion exercises These exercises warm up your muscles and joints and improve the movement and flexibility of your hips and your back. These exercises also help to relieve pain, numbness, and tingling. Exercise A: Sciatic nerve glide 8. Sit in a chair with your head facing down toward your chest. Place your hands behind your back. Let your shoulders slump  forward. 9. Slowly straighten one of your knees while you tilt your head back as if you are looking toward the ceiling. Only straighten your leg as far as you can without making your symptoms worse. 10. Hold for __________ seconds. 11. Slowly return to the starting position. 12. Repeat with your other leg. Repeat __________ times. Complete this exercise __________ times a day. Exercise B: Knee to chest with hip adduction and internal rotation  1. Lie on your back on a firm surface with both legs straight. 2. Bend one of your knees and move it up toward your chest until you feel a gentle stretch in your lower back and buttock. Then, move your knee toward the shoulder that is on the opposite side from your leg. ? Hold your leg in this position by holding onto the front of your knee. 3. Hold for __________ seconds. 4. Slowly return to the starting position. 5. Repeat with your other leg. Repeat __________ times. Complete this exercise __________ times a day. Exercise C: Prone extension on elbows  1. Lie on your abdomen on a firm  surface. A bed may be too soft for this exercise. 2. Prop yourself up on your elbows. 3. Use your arms to help lift your chest up until you feel a gentle stretch in your abdomen and your lower back. ? This will place some of your body weight on your elbows. If this is uncomfortable, try stacking pillows under your chest. ? Your hips should stay down, against the surface that you are lying on. Keep your hip and back muscles relaxed. 4. Hold for __________ seconds. 5. Slowly relax your upper body and return to the starting position. Repeat __________ times. Complete this exercise __________ times a day. Strengthening exercises These exercises build strength and endurance in your back. Endurance is the ability to use your muscles for a long time, even after they get tired. Exercise D: Pelvic tilt 1. Lie on your back on a firm surface. Bend your knees and keep your feet  flat. 2. Tense your abdominal muscles. Tip your pelvis up toward the ceiling and flatten your lower back into the floor. ? To help with this exercise, you may place a small towel under your lower back and try to push your back into the towel. 3. Hold for __________ seconds. 4. Let your muscles relax completely before you repeat this exercise. Repeat __________ times. Complete this exercise __________ times a day. Exercise E: Alternating arm and leg raises  1. Get on your hands and knees on a firm surface. If you are on a hard floor, you may want to use padding to cushion your knees, such as an exercise mat. 2. Line up your arms and legs. Your hands should be below your shoulders, and your knees should be below your hips. 3. Lift your left leg behind you. At the same time, raise your right arm and straighten it in front of you. ? Do not lift your leg higher than your hip. ? Do not lift your arm higher than your shoulder. ? Keep your abdominal and back muscles tight. ? Keep your hips facing the ground. ? Do not arch your back. ? Keep your balance carefully, and do not hold your breath. 4. Hold for __________ seconds. 5. Slowly return to the starting position and repeat with your right leg and your left arm. Repeat __________ times. Complete this exercise __________ times a day. Posture and body mechanics  Body mechanics refers to the movements and positions of your body while you do your daily activities. Posture is part of body mechanics. Good posture and healthy body mechanics can help to relieve stress in your body's tissues and joints. Good posture means that your spine is in its natural S-curve position (your spine is neutral), your shoulders are pulled back slightly, and your head is not tipped forward. The following are general guidelines for applying improved posture and body mechanics to your everyday activities. Standing   When standing, keep your spine neutral and your feet about  hip-width apart. Keep a slight bend in your knees. Your ears, shoulders, and hips should line up.  When you do a task in which you stand in one place for a long time, place one foot up on a stable object that is 2-4 inches (5-10 cm) high, such as a footstool. This helps keep your spine neutral. Sitting   When sitting, keep your spine neutral and keep your feet flat on the floor. Use a footrest, if necessary, and keep your thighs parallel to the floor. Avoid rounding your shoulders, and avoid tilting  your head forward.  When working at a desk or a computer, keep your desk at a height where your hands are slightly lower than your elbows. Slide your chair under your desk so you are close enough to maintain good posture.  When working at a computer, place your monitor at a height where you are looking straight ahead and you do not have to tilt your head forward or downward to look at the screen. Resting   When lying down and resting, avoid positions that are most painful for you.  If you have pain with activities such as sitting, bending, stooping, or squatting (flexion-based activities), lie in a position in which your body does not bend very much. For example, avoid curling up on your side with your arms and knees near your chest (fetal position).  If you have pain with activities such as standing for a long time or reaching with your arms (extension-based activities), lie with your spine in a neutral position and bend your knees slightly. Try the following positions: ? Lying on your side with a pillow between your knees. ? Lying on your back with a pillow under your knees. Lifting   When lifting objects, keep your feet at least shoulder-width apart and tighten your abdominal muscles.  Bend your knees and hips and keep your spine neutral. It is important to lift using the strength of your legs, not your back. Do not lock your knees straight out.  Always ask for help to lift heavy or awkward  objects. This information is not intended to replace advice given to you by your health care provider. Make sure you discuss any questions you have with your health care provider. Document Released: 07/23/2005 Document Revised: 03/29/2016 Document Reviewed: 04/08/2015 Elsevier Interactive Patient Education  [employer].

## 2017-02-04 NOTE — Progress Notes (Signed)
Low-risk OB appointment [id number] [redacted]w[redacted]d Estimated Date of Delivery: 03/18/17 BP 130/80   Pulse 78   Wt 202 lb (91.6 kg)   LMP 06/11/2016   BMI 35.78 kg/m   BP, weight, and urine reviewed.  Refer to obstetrical flow sheet for FH & FHR.  Reports good fm.  Denies regular uc's, lof, vb, or uti s/s. No complaints. No care since 28wks- states she was working. 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 Gave printed exercises.  Reviewed pn2 results, ptl s/s, fkc. Plan:  Continue routine obstetrical care  F/U in 2wks for OB appointment  Rhogam today

## 2017-02-18 ENCOUNTER — Encounter: Payer: Self-pay | Admitting: Women's Health

## 2017-02-18 ENCOUNTER — Ambulatory Visit (INDEPENDENT_AMBULATORY_CARE_PROVIDER_SITE_OTHER): Payer: BLUE CROSS/BLUE SHIELD | Admitting: Women's Health

## 2017-02-18 VITALS — BP 126/82 | HR 103 | Temp 98.5°F | Wt 203.0 lb

## 2017-02-18 DIAGNOSIS — Z3483 Encounter for supervision of other normal pregnancy, third trimester: Secondary | ICD-10-CM

## 2017-02-18 DIAGNOSIS — Z1389 Encounter for screening for other disorder: Secondary | ICD-10-CM

## 2017-02-18 DIAGNOSIS — Z3A36 36 weeks gestation of pregnancy: Secondary | ICD-10-CM

## 2017-02-18 DIAGNOSIS — Z331 Pregnant state, incidental: Secondary | ICD-10-CM

## 2017-02-18 LAB — POCT URINALYSIS DIPSTICK
Blood, UA: NEGATIVE
GLUCOSE UA: NEGATIVE
KETONES UA: NEGATIVE
Nitrite, UA: NEGATIVE
Protein, UA: NEGATIVE

## 2017-02-18 NOTE — Progress Notes (Signed)
Low-risk OB appointment [id number] [redacted]w[redacted]d Estimated Date of Delivery: 03/18/17 BP 126/82   Pulse (!) 103   Wt 203 lb (92.1 kg)   LMP 06/11/2016   BMI 35.96 kg/m   BP, weight, and urine reviewed.  Refer to obstetrical flow sheet for FH & FHR.  Reports good fm.  Denies regular uc's, lof, vb, or uti s/s. Feels hot like she has a fever. Face/chest/posterior arms look red. States she went out to the pool Sat evening, worse sun screen. Appears to be sunburn. Temp normal at 98.5. No other sx (cold, uti, etc).  Reviewed labor s/s, fkc. Plan:  Continue routine obstetrical care  F/U in 1wk for OB appointment and gbs

## 2017-02-18 NOTE — Patient Instructions (Signed)
Call the office ([phone]) or go to [facility] if:  You begin to have strong, frequent contractions  Your water breaks.  Sometimes it is a big gush of fluid, sometimes it is just a trickle that keeps getting your panties wet or running down your legs  You have vaginal bleeding.  It is normal to have a small amount of spotting if your cervix was checked.   You don't feel your baby moving like normal.  If you don't, get you something to eat and drink and lay down and focus on feeling your baby move.  You should feel at least 10 movements in 2 hours.  If you don't, you should call the office or go to [facility].     Braxton Hicks Contractions Contractions of the uterus can occur throughout pregnancy, but they are not always a sign that you are in labor. You may have practice contractions called Braxton Hicks contractions. These false labor contractions are sometimes confused with true labor. What are Braxton Hicks contractions? Braxton Hicks contractions are tightening movements that occur in the muscles of the uterus before labor. Unlike true labor contractions, these contractions do not result in opening (dilation) and thinning of the cervix. Toward the end of pregnancy (32-34 weeks), Braxton Hicks contractions can happen more often and may become stronger. These contractions are sometimes difficult to tell apart from true labor because they can be very uncomfortable. You should not feel embarrassed if you go to the hospital with false labor. Sometimes, the only way to tell if you are in true labor is for your health care provider to look for changes in the cervix. The health care provider will do a physical exam and may monitor your contractions. If you are not in true labor, the exam should show that your cervix is not dilating and your water has not broken. If there are no prenatal problems or other health problems associated with your pregnancy, it is completely safe for you to be sent  home with false labor. You may continue to have Braxton Hicks contractions until you go into true labor. How can I tell the difference between true labor and false labor?  Differences ? False labor ? Contractions last 30-70 seconds.: Contractions are usually shorter and not as strong as true labor contractions. ? Contractions become very regular.: Contractions are usually irregular. ? Discomfort is usually felt in the top of the uterus, and it spreads to the lower abdomen and low back.: Contractions are often felt in the front of the lower abdomen and in the groin. ? Contractions do not go away with walking.: Contractions may go away when you walk around or change positions while lying down. ? Contractions usually become more intense and increase in frequency.: Contractions get weaker and are shorter-lasting as time goes on. ? The cervix dilates and gets thinner.: The cervix usually does not dilate or become thin. Follow these instructions at home:  Take over-the-counter and prescription medicines only as told by your health care provider.  Keep up with your usual exercises and follow other instructions from your health care provider.  Eat and drink lightly if you think you are going into labor.  If Braxton Hicks contractions are making you uncomfortable: ? Change your position from lying down or resting to walking, or change from walking to resting. ? Sit and rest in a tub of warm water. ? Drink enough fluid to keep your urine clear or pale yellow. Dehydration may cause these contractions. ?   Do slow and deep breathing several times an hour.  Keep all follow-up prenatal visits as told by your health care provider. This is important. Contact a health care provider if:  You have a fever.  You have continuous pain in your abdomen. Get help right away if:  Your contractions become stronger, more regular, and closer together.  You have fluid leaking or gushing from your vagina.  You  pass blood-tinged mucus (bloody show).  You have bleeding from your vagina.  You have low back pain that you never had before.  You feel your baby's head pushing down and causing pelvic pressure.  Your baby is not moving inside you as much as it used to. Summary  Contractions that occur before labor are called Braxton Hicks contractions, false labor, or practice contractions.  Braxton Hicks contractions are usually shorter, weaker, farther apart, and less regular than true labor contractions. True labor contractions usually become progressively stronger and regular and they become more frequent.  Manage discomfort from Braxton Hicks contractions by changing position, resting in a warm bath, drinking plenty of water, or practicing deep breathing. This information is not intended to replace advice given to you by your health care provider. Make sure you discuss any questions you have with your health care provider. Document Released: 07/23/2005 Document Revised: 06/11/2016 Document Reviewed: 06/11/2016 Elsevier Interactive Patient Education  2017 [employer]  

## 2017-02-28 ENCOUNTER — Encounter (HOSPITAL_COMMUNITY): Payer: Self-pay | Admitting: *Deleted

## 2017-02-28 ENCOUNTER — Encounter: Payer: Self-pay | Admitting: Women's Health

## 2017-02-28 ENCOUNTER — Inpatient Hospital Stay (EMERGENCY_DEPARTMENT_HOSPITAL)
Admission: AD | Admit: 2017-02-28 | Discharge: 2017-02-28 | Discharge status: Against Medical Advice | Payer: BLUE CROSS/BLUE SHIELD | Source: Ambulatory Visit | Attending: Obstetrics and Gynecology | Admitting: Obstetrics and Gynecology

## 2017-02-28 ENCOUNTER — Ambulatory Visit (INDEPENDENT_AMBULATORY_CARE_PROVIDER_SITE_OTHER): Payer: BLUE CROSS/BLUE SHIELD | Admitting: Women's Health

## 2017-02-28 VITALS — BP 150/100 | HR 78 | Wt 203.0 lb

## 2017-02-28 DIAGNOSIS — F419 Anxiety disorder, unspecified: Secondary | ICD-10-CM

## 2017-02-28 DIAGNOSIS — K219 Gastro-esophageal reflux disease without esophagitis: Secondary | ICD-10-CM

## 2017-02-28 DIAGNOSIS — L409 Psoriasis, unspecified: Secondary | ICD-10-CM

## 2017-02-28 DIAGNOSIS — Z818 Family history of other mental and behavioral disorders: Secondary | ICD-10-CM | POA: Insufficient documentation

## 2017-02-28 DIAGNOSIS — Z86711 Personal history of pulmonary embolism: Secondary | ICD-10-CM | POA: Insufficient documentation

## 2017-02-28 DIAGNOSIS — Z1389 Encounter for screening for other disorder: Secondary | ICD-10-CM

## 2017-02-28 DIAGNOSIS — F329 Major depressive disorder, single episode, unspecified: Secondary | ICD-10-CM | POA: Insufficient documentation

## 2017-02-28 DIAGNOSIS — Z888 Allergy status to other drugs, medicaments and biological substances status: Secondary | ICD-10-CM | POA: Insufficient documentation

## 2017-02-28 DIAGNOSIS — O99613 Diseases of the digestive system complicating pregnancy, third trimester: Secondary | ICD-10-CM

## 2017-02-28 DIAGNOSIS — Z8249 Family history of ischemic heart disease and other diseases of the circulatory system: Secondary | ICD-10-CM | POA: Insufficient documentation

## 2017-02-28 DIAGNOSIS — O134 Gestational [pregnancy-induced] hypertension without significant proteinuria, complicating childbirth: Secondary | ICD-10-CM | POA: Diagnosis not present

## 2017-02-28 DIAGNOSIS — Z882 Allergy status to sulfonamides status: Secondary | ICD-10-CM | POA: Insufficient documentation

## 2017-02-28 DIAGNOSIS — Z3A37 37 weeks gestation of pregnancy: Secondary | ICD-10-CM | POA: Diagnosis not present

## 2017-02-28 DIAGNOSIS — Z79899 Other long term (current) drug therapy: Secondary | ICD-10-CM | POA: Insufficient documentation

## 2017-02-28 DIAGNOSIS — O139 Gestational [pregnancy-induced] hypertension without significant proteinuria, unspecified trimester: Secondary | ICD-10-CM

## 2017-02-28 DIAGNOSIS — O1413 Severe pre-eclampsia, third trimester: Secondary | ICD-10-CM

## 2017-02-28 DIAGNOSIS — O99333 Smoking (tobacco) complicating pregnancy, third trimester: Secondary | ICD-10-CM | POA: Insufficient documentation

## 2017-02-28 DIAGNOSIS — O99713 Diseases of the skin and subcutaneous tissue complicating pregnancy, third trimester: Secondary | ICD-10-CM | POA: Insufficient documentation

## 2017-02-28 DIAGNOSIS — Z811 Family history of alcohol abuse and dependence: Secondary | ICD-10-CM | POA: Insufficient documentation

## 2017-02-28 DIAGNOSIS — Z331 Pregnant state, incidental: Secondary | ICD-10-CM

## 2017-02-28 DIAGNOSIS — Z823 Family history of stroke: Secondary | ICD-10-CM | POA: Insufficient documentation

## 2017-02-28 DIAGNOSIS — Z5321 Procedure and treatment not carried out due to patient leaving prior to being seen by health care provider: Secondary | ICD-10-CM

## 2017-02-28 DIAGNOSIS — O1493 Unspecified pre-eclampsia, third trimester: Secondary | ICD-10-CM

## 2017-02-28 DIAGNOSIS — O99343 Other mental disorders complicating pregnancy, third trimester: Secondary | ICD-10-CM | POA: Insufficient documentation

## 2017-02-28 DIAGNOSIS — F17210 Nicotine dependence, cigarettes, uncomplicated: Secondary | ICD-10-CM

## 2017-02-28 DIAGNOSIS — Z3483 Encounter for supervision of other normal pregnancy, third trimester: Secondary | ICD-10-CM | POA: Diagnosis not present

## 2017-02-28 DIAGNOSIS — R030 Elevated blood-pressure reading, without diagnosis of hypertension: Secondary | ICD-10-CM

## 2017-02-28 DIAGNOSIS — Z7901 Long term (current) use of anticoagulants: Secondary | ICD-10-CM | POA: Insufficient documentation

## 2017-02-28 DIAGNOSIS — O4292 Full-term premature rupture of membranes, unspecified as to length of time between rupture and onset of labor: Secondary | ICD-10-CM | POA: Diagnosis not present

## 2017-02-28 LAB — URINALYSIS, ROUTINE W REFLEX MICROSCOPIC
Bilirubin Urine: NEGATIVE
GLUCOSE, UA: NEGATIVE mg/dL
Hgb urine dipstick: NEGATIVE
KETONES UR: NEGATIVE mg/dL
Nitrite: NEGATIVE
PROTEIN: NEGATIVE mg/dL
Specific Gravity, Urine: 1.014 (ref 1.005–1.030)
pH: 5 (ref 5.0–8.0)

## 2017-02-28 LAB — CBC WITH DIFFERENTIAL/PLATELET
BASOS ABS: 0 10*3/uL (ref 0.0–0.1)
Basophils Relative: 0 %
Eosinophils Absolute: 0 10*3/uL (ref 0.0–0.7)
Eosinophils Relative: 0 %
HEMATOCRIT: 38.8 % (ref 36.0–46.0)
HEMOGLOBIN: 13.5 g/dL (ref 12.0–15.0)
Lymphocytes Relative: 21 %
Lymphs Abs: 1.8 10*3/uL (ref 0.7–4.0)
MCH: 28.5 pg (ref 26.0–34.0)
MCHC: 34.8 g/dL (ref 30.0–36.0)
MCV: 82 fL (ref 78.0–100.0)
MONOS PCT: 3 %
Monocytes Absolute: 0.2 10*3/uL (ref 0.1–1.0)
NEUTROS ABS: 6.4 10*3/uL (ref 1.7–7.7)
NEUTROS PCT: 76 %
Platelets: 240 10*3/uL (ref 150–400)
RBC: 4.73 MIL/uL (ref 3.87–5.11)
RDW: 13.9 % (ref 11.5–15.5)
WBC: 8.5 10*3/uL (ref 4.0–10.5)

## 2017-02-28 LAB — POCT URINALYSIS DIPSTICK
Blood, UA: NEGATIVE
GLUCOSE UA: NEGATIVE
Ketones, UA: NEGATIVE
LEUKOCYTES UA: NEGATIVE
Nitrite, UA: NEGATIVE

## 2017-02-28 LAB — COMPREHENSIVE METABOLIC PANEL
ALBUMIN: 3.3 g/dL — AB (ref 3.5–5.0)
ALK PHOS: 109 U/L (ref 38–126)
ALT: 12 U/L — AB (ref 14–54)
AST: 18 U/L (ref 15–41)
Anion gap: 9 (ref 5–15)
BILIRUBIN TOTAL: 0.5 mg/dL (ref 0.3–1.2)
CO2: 24 mmol/L (ref 22–32)
Calcium: 9.2 mg/dL (ref 8.9–10.3)
Chloride: 102 mmol/L (ref 101–111)
Creatinine, Ser: 0.53 mg/dL (ref 0.44–1.00)
GFR calc Af Amer: >60 mL/min (ref >60)
GFR calc non Af Amer: >60 mL/min (ref >60)
GLUCOSE: 94 mg/dL (ref 65–99)
Potassium: 3.3 mmol/L — ABNORMAL LOW (ref 3.5–5.1)
Sodium: 135 mmol/L (ref 135–145)
TOTAL PROTEIN: 7.2 g/dL (ref 6.5–8.1)

## 2017-02-28 LAB — PROTEIN / CREATININE RATIO, URINE
Creatinine, Urine: 118 mg/dL
Protein Creatinine Ratio: 0.18 mg/mg{Cre} — ABNORMAL HIGH (ref 0.00–0.15)
Total Protein, Urine: 21 mg/dL

## 2017-02-28 NOTE — MAU Note (Signed)
Pt sent from office for elevated b/p's. Denies any headache or visual changes. Good fetal movement reported.

## 2017-02-28 NOTE — MAU Note (Signed)
Pt signed out AMA, risks discussed with pt by NP.  Instructed pt to return for HA, visual changes, epigastric pain, vomiting, SOB.  Pt verbalizes understanding.

## 2017-02-28 NOTE — MAU Note (Signed)
Pt on lovenox for hx of PP PE.  Last dose was July 24.

## 2017-02-28 NOTE — MAU Provider Note (Signed)
History     CSN: [id number]  Arrival date and time: 02/28/17 1327   First Provider Initiated Contact with Patient 02/28/17 1431      Chief Complaint  Patient presents with  . Hypertension   HPI   Ms.Roxsana Riding is a 31 y.o. female [phone] @ 67w3dhere in MAU for evaluation of her BP. She was at her OB's office today for a scheduled visit and her BP was noted to be high 150/100. She was sent here for further evaluation.  History of PE postpartum. On Lovenox. Has forgotten to take her Lovenox the last 2 days. + smoker. Has missed OB appointments.   + fetal movement. Denies bleeding or leaking of fluid.   OB History    Gravida Para Term Preterm AB Living   4 3 3     3    SAB TAB Ectopic Multiple Live Births         0 3      Past Medical History:  Diagnosis Date  . Anxiety   . Bronchitis   . Depression   . GERD (gastroesophageal reflux disease)   . Plantar fasciitis   . Psoriasis   [place]   . Sinusitis, acute     Past Surgical History:  Procedure Laterality Date  . APPENDECTOMY    . WISDOM TOOTH EXTRACTION      Family History  Problem Relation Age of Onset  . Alcohol abuse Mother   . Hypertension Father   . Depression Father   . Alcohol abuse Father   . Hyperlipidemia Father   . Stroke Father   . Cancer Maternal Grandmother        lung  . Depression Paternal Grandmother   . Kidney disease Paternal Grandfather   . Heart disease Paternal Grandfather   . Heart murmur Son     Social History  Substance Use Topics  . Smoking status: Current Every Day Smoker    Packs/day: 0.50    Types: Cigarettes  . Smokeless tobacco: Never Used  . Alcohol use No    Allergies:  Allergies  Allergen Reactions  . Macrobid [Nitrofurantoin] Rash  . Sulfa Antibiotics Rash    Prescriptions Prior to Admission  Medication Sig Dispense Refill Last Dose  . DULoxetine (CYMBALTA) 30 MG capsule Take 1 capsule (30 mg total) by mouth daily. 30 capsule 5  Taking  . enoxaparin (LOVENOX) 100 MG/ML injection Inject 1 mL (100 mg total) into the skin daily. 30 mL 6 Taking  . esomeprazole (NEXIUM) 20 MG capsule Take 20 mg by mouth daily as needed (indigestion).    Taking  . omeprazole (PRILOSEC) 20 MG capsule Take 1 capsule (20 mg total) by mouth daily. 1 tablet a day (Patient not taking: Reported on 02/04/2017) 30 capsule 6 Not Taking  . Pediatric Multiple Vit-C-FA (FLINSTONES GUMMIES OMEGA-3 DHA PO) Take by mouth.   Taking  . ranitidine (ZANTAC) 75 MG tablet Take 75 mg by mouth 2 (two) times daily.   Not Taking  . traMADol (ULTRAM) 50 MG tablet Take by mouth every 6 (six) hours as needed.   Not Taking   Results for orders placed or performed during the hospital encounter of 02/28/17 (from the past 48 hour(s))  Protein / creatinine ratio, urine     Status: Abnormal   Collection Time: 02/28/17  2:01 PM  Result Value Ref Range   Creatinine, Urine 118.00 mg/dL   Total Protein, Urine 21 mg/dL    Comment: NO NORMAL  RANGE ESTABLISHED FOR THIS TEST   Protein Creatinine Ratio 0.18 (H) 0.00 - 0.15 mg/mg[Cre]  Urinalysis, Routine w reflex microscopic     Status: Abnormal   Collection Time: 02/28/17  2:01 PM  Result Value Ref Range   Color, Urine YELLOW YELLOW   APPearance CLEAR CLEAR   Specific Gravity, Urine 1.014 1.005 - 1.030   pH 5.0 5.0 - 8.0   Glucose, UA NEGATIVE NEGATIVE mg/dL   Hgb urine dipstick NEGATIVE NEGATIVE   Bilirubin Urine NEGATIVE NEGATIVE   Ketones, ur NEGATIVE NEGATIVE mg/dL   Protein, ur NEGATIVE NEGATIVE mg/dL   Nitrite NEGATIVE NEGATIVE   Leukocytes, UA TRACE (A) NEGATIVE   RBC / HPF 0-5 0 - 5 RBC/hpf   WBC, UA 0-5 0 - 5 WBC/hpf   Bacteria, UA FEW (A) NONE SEEN   Squamous Epithelial / LPF 0-5 (A) NONE SEEN   Mucous PRESENT   CBC with Differential/Platelet     Status: None   Collection Time: 02/28/17  2:02 PM  Result Value Ref Range   WBC 8.5 4.0 - 10.5 K/uL   RBC 4.73 3.87 - 5.11 MIL/uL   Hemoglobin 13.5 12.0 - 15.0 g/dL    HCT 38.8 36.0 - 46.0 %   MCV 82.0 78.0 - 100.0 fL   MCH 28.5 26.0 - 34.0 pg   MCHC 34.8 30.0 - 36.0 g/dL   RDW 13.9 11.5 - 15.5 %   Platelets 240 150 - 400 K/uL   Neutrophils Relative % 76 %   Neutro Abs 6.4 1.7 - 7.7 K/uL   Lymphocytes Relative 21 %   Lymphs Abs 1.8 0.7 - 4.0 K/uL   Monocytes Relative 3 %   Monocytes Absolute 0.2 0.1 - 1.0 K/uL   Eosinophils Relative 0 %   Eosinophils Absolute 0.0 0.0 - 0.7 K/uL   Basophils Relative 0 %   Basophils Absolute 0.0 0.0 - 0.1 K/uL  Comprehensive metabolic panel     Status: Abnormal   Collection Time: 02/28/17  2:02 PM  Result Value Ref Range   Sodium 135 135 - 145 mmol/L   Potassium 3.3 (L) 3.5 - 5.1 mmol/L   Chloride 102 101 - 111 mmol/L   CO2 24 22 - 32 mmol/L   Glucose, Bld 94 65 - 99 mg/dL   BUN <5 (L) 6 - 20 mg/dL    Comment: REPEATED TO VERIFY   Creatinine, Ser 0.53 0.44 - 1.00 mg/dL   Calcium 9.2 8.9 - 10.3 mg/dL   Total Protein 7.2 6.5 - 8.1 g/dL   Albumin 3.3 (L) 3.5 - 5.0 g/dL   AST 18 15 - 41 U/L   ALT 12 (L) 14 - 54 U/L   Alkaline Phosphatase 109 38 - 126 U/L   Total Bilirubin 0.5 0.3 - 1.2 mg/dL   GFR calc non Af Amer >60 >60 mL/min   GFR calc Af Amer >60 >60 mL/min    Comment: (NOTE) The eGFR has been calculated using the CKD EPI equation. This calculation has not been validated in all clinical situations. eGFR's persistently <60 mL/min signify possible Chronic Kidney Disease.    Anion gap 9 5 - 15   Review of Systems  Constitutional: Negative for fever.  Eyes: Negative for photophobia.  Gastrointestinal: Negative for abdominal pain.  Neurological: Negative for headaches.   Physical Exam   Blood pressure (!) 145/69, pulse 95, temperature 98.5 F (36.9 C), resp. rate 18, height 5' 5"  (1.651 m), weight 204 lb (92.5 kg), last menstrual period  06/11/2016, unknown if currently breastfeeding.  Patient Vitals for the past 24 hrs:  BP Temp Pulse Resp Height Weight  02/28/17 1516 (!) 163/86 - (!) 144 - - -   02/28/17 1500 (!) 142/68 - (!) 114 - - -  02/28/17 1446 135/60 - (!) 110 - - -  02/28/17 1431 (!) 146/72 - 97 - - -  02/28/17 1424 (!) 145/69 - 95 - - -  02/28/17 1418 138/70 - 95 - - -  02/28/17 1356 (!) 156/86 98.5 F (36.9 C) (!) 114 18 5' 5"  (1.651 m) 204 lb (92.5 kg)    Physical Exam  Constitutional: She is oriented to person, place, and time. She appears well-developed and well-nourished. No distress.  HENT:  Head: Normocephalic.  Cardiovascular: Normal rate.   Respiratory: Effort normal.  GI: Soft. She exhibits no distension. There is no tenderness. There is no rebound.  Musculoskeletal: Normal range of motion.  Neurological: She is alert and oriented to person, place, and time. She has normal reflexes. She displays normal reflexes.  Negative clonus   Skin: Skin is warm. She is not diaphoretic.  Psychiatric: Her behavior is normal.   Fetal Tracing: Baseline: 145 bpm Variability: Moderate  Accelerations: 15x15 with prolonged acceleration with rate of 180, then back to baseline  Decelerations: variable  Toco: Quiet   MAU Course  Procedures  None  MDM  Patient has gestational HTN: First elevated BP this morning at 0942: 150/80; We recommend admission for induction of labor for gestational HTN @ [redacted]w[redacted]d Discussed with Dr. ERip Harbourwho also recommends admission.  Discussed BP readings and labs with the patient in detail. Recommend admission to the patient who states that she does not want to stay. States she does not want to be induced. Reviewed risks associated with gestational HTN/Preeclampsia including abruption, eclampsia, stroke, maternal and fetal death. Patient aware and desires to leave AMA.  Notified Dr. ERip Harbour Patient signed AMA form and left MAU.  Discharge BP reports severe range: patient aware. Preeclampsia reviewed again.   Assessment and Plan   A:  Preeclampsia @ 319w3discharge BP 163/86  P:  Patient left AMA   Savreen Gebhardt, JeArtist PaisNP 02/28/2017 3:44  PM

## 2017-02-28 NOTE — Patient Instructions (Addendum)
Call the office [phone]) or go to [facility] if:  You begin to have strong, frequent contractions  Your water breaks.  Sometimes it is a big gush of fluid, sometimes it is just a trickle that keeps getting your panties wet or running down your legs  You have vaginal bleeding.  It is normal to have a small amount of spotting if your cervix was checked.   You don't feel your baby moving like normal.  If you don't, get you something to eat and drink and lay down and focus on feeling your baby move.  You should feel at least 10 movements in 2 hours.  If you don't, you should call the office or go to [facility].    Call the office [phone]) or go to [facility] hospital for these signs of pre-eclampsia:  Severe headache that does not go away with Tylenol  Visual changes- seeing spots, double, blurred vision  Pain under your right breast or upper abdomen that does not go away with Tums or heartburn medicine  Nausea and/or vomiting  Severe swelling in your hands, feet, and face       Braxton Hicks Contractions Contractions of the uterus can occur throughout pregnancy, but they are not always a sign that you are in labor. You may have practice contractions called Braxton Hicks contractions. These false labor contractions are sometimes confused with true labor. What are [place] contractions? Braxton Hicks contractions are tightening movements that occur in the muscles of the uterus before labor. Unlike true labor contractions, these contractions do not result in opening (dilation) and thinning of the cervix. Toward the end of pregnancy (32-34 weeks), Braxton Hicks contractions can happen more often and may become stronger. These contractions are sometimes difficult to tell apart from true labor because they can be very uncomfortable. You should not feel embarrassed if you go to the hospital with false labor. Sometimes, the only way to tell if you are in true labor is for your  health care provider to look for changes in the cervix. The health care provider will do a physical exam and may monitor your contractions. If you are not in true labor, the exam should show that your cervix is not dilating and your water has not broken. If there are no prenatal problems or other health problems associated with your pregnancy, it is completely safe for you to be sent home with false labor. You may continue to have Braxton Hicks contractions until you go into true labor. How can I tell the difference between true labor and false labor?  Differences ? False labor ? Contractions last 30-70 seconds.: Contractions are usually shorter and not as strong as true labor contractions. ? Contractions become very regular.: Contractions are usually irregular. ? Discomfort is usually felt in the top of the uterus, and it spreads to the lower abdomen and low back.: Contractions are often felt in the front of the lower abdomen and in the groin. ? Contractions do not go away with walking.: Contractions may go away when you walk around or change positions while lying down. ? Contractions usually become more intense and increase in frequency.: Contractions get weaker and are shorter-lasting as time goes on. ? The cervix dilates and gets thinner.: The cervix usually does not dilate or become thin. Follow these instructions at home:  Take over-the-counter and prescription medicines only as told by your health care provider.  Keep up with your usual exercises and follow other instructions from your  health care provider.  Eat and drink lightly if you think you are going into labor.  If Braxton Hicks contractions are making you uncomfortable: ? Change your position from lying down or resting to walking, or change from walking to resting. ? Sit and rest in a tub of warm water. ? Drink enough fluid to keep your urine clear or pale yellow. Dehydration may cause these contractions. ? Do slow and deep  breathing several times an hour.  Keep all follow-up prenatal visits as told by your health care provider. This is important. Contact a health care provider if:  You have a fever.  You have continuous pain in your abdomen. Get help right away if:  Your contractions become stronger, more regular, and closer together.  You have fluid leaking or gushing from your vagina.  You pass blood-tinged mucus (bloody show).  You have bleeding from your vagina.  You have low back pain that you never had before.  You feel your baby's head pushing down and causing pelvic pressure.  Your baby is not moving inside you as much as it used to. Summary  Contractions that occur before labor are called Braxton Hicks contractions, false labor, or practice contractions.  Braxton Hicks contractions are usually shorter, weaker, farther apart, and less regular than true labor contractions. True labor contractions usually become progressively stronger and regular and they become more frequent.  Manage discomfort from [facility] contractions by changing position, resting in a warm bath, drinking plenty of water, or practicing deep breathing. This information is not intended to replace advice given to you by your health care provider. Make sure you discuss any questions you have with your health care provider. Document Released: 07/23/2005 Document Revised: 06/11/2016 Document Reviewed: 06/11/2016 Elsevier Interactive Patient Education  2017 [employer].

## 2017-02-28 NOTE — Progress Notes (Signed)
Low-risk OB appointment [id number] [redacted]w[redacted]d Estimated Date of Delivery: 03/18/17 BP (!) 150/80   Pulse 78   Wt 203 lb (92.1 kg)   LMP 06/11/2016   BMI 35.96 kg/m   BP recheck 150/100 BP, weight, and urine reviewed.  Refer to obstetrical flow sheet for FH & FHR.  Reports good fm.  Denies regular uc's, lof, vb, or uti s/s. Diarrhea last night. Nausea this am, has vomited x 1, some dry heaves. Just doesn't feel well. Denies ha, visual changes, ruq/epigastric pain. Does have h/o GHTN x 2. Is on Lovenox for h/o PE.  Tr proteinuria today, has had sporadically during pregnancy Tr BLE edema, DTRs 2+, no clonus GBS, gc/ct collected SVE p er request: 3/th/-2, vtx Reviewed labor s/s, fkc, pre-e s/s. Plan:  To [facility] MAU for pre-e eval, notified Kerry Hough, PA to expect pt F/U on Monday for OB appointment, bpp/dopp u/s if d/c'd today

## 2017-03-02 ENCOUNTER — Inpatient Hospital Stay (HOSPITAL_COMMUNITY)
Admission: AD | Admit: 2017-03-02 | Discharge: 2017-03-04 | DRG: 774 | Disposition: A | Discharge status: Home / Self Care | Payer: BLUE CROSS/BLUE SHIELD | Source: Ambulatory Visit | Attending: Obstetrics and Gynecology | Admitting: Obstetrics and Gynecology

## 2017-03-02 ENCOUNTER — Encounter (HOSPITAL_COMMUNITY): Payer: Self-pay | Admitting: *Deleted

## 2017-03-02 DIAGNOSIS — O9962 Diseases of the digestive system complicating childbirth: Secondary | ICD-10-CM | POA: Diagnosis present

## 2017-03-02 DIAGNOSIS — Z6791 Unspecified blood type, Rh negative: Secondary | ICD-10-CM | POA: Diagnosis not present

## 2017-03-02 DIAGNOSIS — O134 Gestational [pregnancy-induced] hypertension without significant proteinuria, complicating childbirth: Principal | ICD-10-CM | POA: Diagnosis present

## 2017-03-02 DIAGNOSIS — O4290 Premature rupture of membranes, unspecified as to length of time between rupture and onset of labor, unspecified weeks of gestation: Secondary | ICD-10-CM | POA: Diagnosis present

## 2017-03-02 DIAGNOSIS — O139 Gestational [pregnancy-induced] hypertension without significant proteinuria, unspecified trimester: Secondary | ICD-10-CM | POA: Diagnosis present

## 2017-03-02 DIAGNOSIS — Z7901 Long term (current) use of anticoagulants: Secondary | ICD-10-CM

## 2017-03-02 DIAGNOSIS — O99334 Smoking (tobacco) complicating childbirth: Secondary | ICD-10-CM | POA: Diagnosis present

## 2017-03-02 DIAGNOSIS — K219 Gastro-esophageal reflux disease without esophagitis: Secondary | ICD-10-CM | POA: Diagnosis present

## 2017-03-02 DIAGNOSIS — F17210 Nicotine dependence, cigarettes, uncomplicated: Secondary | ICD-10-CM | POA: Diagnosis present

## 2017-03-02 DIAGNOSIS — F418 Other specified anxiety disorders: Secondary | ICD-10-CM | POA: Diagnosis present

## 2017-03-02 DIAGNOSIS — O4292 Full-term premature rupture of membranes, unspecified as to length of time between rupture and onset of labor: Secondary | ICD-10-CM | POA: Diagnosis present

## 2017-03-02 DIAGNOSIS — Z3A37 37 weeks gestation of pregnancy: Secondary | ICD-10-CM | POA: Diagnosis not present

## 2017-03-02 DIAGNOSIS — O26899 Other specified pregnancy related conditions, unspecified trimester: Secondary | ICD-10-CM

## 2017-03-02 DIAGNOSIS — Z86711 Personal history of pulmonary embolism: Secondary | ICD-10-CM

## 2017-03-02 DIAGNOSIS — O26893 Other specified pregnancy related conditions, third trimester: Secondary | ICD-10-CM | POA: Diagnosis present

## 2017-03-02 DIAGNOSIS — O99824 Streptococcus B carrier state complicating childbirth: Secondary | ICD-10-CM | POA: Diagnosis present

## 2017-03-02 HISTORY — DX: Gestational (pregnancy-induced) hypertension without significant proteinuria, unspecified trimester: O13.9

## 2017-03-02 LAB — URINALYSIS, ROUTINE W REFLEX MICROSCOPIC
Bilirubin Urine: NEGATIVE
Glucose, UA: NEGATIVE mg/dL
Hgb urine dipstick: NEGATIVE
Ketones, ur: NEGATIVE mg/dL
LEUKOCYTES UA: NEGATIVE
NITRITE: NEGATIVE
PH: 6 (ref 5.0–8.0)
Protein, ur: NEGATIVE mg/dL
SPECIFIC GRAVITY, URINE: 1.009 (ref 1.005–1.030)

## 2017-03-02 LAB — COMPREHENSIVE METABOLIC PANEL
ALT: 9 U/L — AB (ref 14–54)
AST: 19 U/L (ref 15–41)
Albumin: 3.1 g/dL — ABNORMAL LOW (ref 3.5–5.0)
Alkaline Phosphatase: 102 U/L (ref 38–126)
Anion gap: 7 (ref 5–15)
BILIRUBIN TOTAL: 0.6 mg/dL (ref 0.3–1.2)
CHLORIDE: 103 mmol/L (ref 101–111)
CO2: 25 mmol/L (ref 22–32)
CREATININE: 0.42 mg/dL — AB (ref 0.44–1.00)
Calcium: 8.9 mg/dL (ref 8.9–10.3)
GFR calc Af Amer: >60 mL/min (ref >60)
Glucose, Bld: 80 mg/dL (ref 65–99)
Potassium: 3.9 mmol/L (ref 3.5–5.1)
Sodium: 135 mmol/L (ref 135–145)
Total Protein: 6.7 g/dL (ref 6.5–8.1)

## 2017-03-02 LAB — CBC
HEMATOCRIT: 37.9 % (ref 36.0–46.0)
Hemoglobin: 13.2 g/dL (ref 12.0–15.0)
MCH: 28.9 pg (ref 26.0–34.0)
MCHC: 34.8 g/dL (ref 30.0–36.0)
MCV: 82.9 fL (ref 78.0–100.0)
PLATELETS: 236 10*3/uL (ref 150–400)
RBC: 4.57 MIL/uL (ref 3.87–5.11)
RDW: 14.2 % (ref 11.5–15.5)
WBC: 10.7 10*3/uL — AB (ref 4.0–10.5)

## 2017-03-02 LAB — PROTEIN / CREATININE RATIO, URINE
CREATININE, URINE: 55 mg/dL
Protein Creatinine Ratio: 0.15 mg/mg{Cre} (ref 0.00–0.15)
Total Protein, Urine: 8 mg/dL

## 2017-03-02 LAB — GC/CHLAMYDIA PROBE AMP
CHLAMYDIA, DNA PROBE: NEGATIVE
NEISSERIA GONORRHOEAE BY PCR: NEGATIVE

## 2017-03-02 LAB — STREP GP B NAA: STREP GROUP B AG: POSITIVE — AB

## 2017-03-02 LAB — POCT FERN TEST: POCT Fern Test: POSITIVE

## 2017-03-02 MED ORDER — FENTANYL CITRATE (PF) 100 MCG/2ML IJ SOLN
100.0000 ug | INTRAMUSCULAR | Status: DC | PRN
  Administered 2017-03-03: 100 ug via INTRAVENOUS
  Filled 2017-03-02: qty 2

## 2017-03-02 MED ORDER — DULOXETINE HCL 30 MG PO CPEP: 30.0000 mg | ORAL_CAPSULE | Freq: Every day | ORAL | Status: DC

## 2017-03-02 MED ORDER — SOD CITRATE-CITRIC ACID 500-334 MG/5ML PO SOLN: 30.0000 mL | ORAL | Status: DC | PRN

## 2017-03-02 MED ORDER — OXYTOCIN 40 UNITS IN LACTATED RINGERS INFUSION - SIMPLE MED
1.0000 m[IU]/min | INTRAVENOUS | Status: DC
  Administered 2017-03-02: 2 m[IU]/min via INTRAVENOUS

## 2017-03-02 MED ORDER — FLEET ENEMA 7-19 GM/118ML RE ENEM
1.0000 | ENEMA | RECTAL | Status: DC | PRN
Start: 2017-03-02 — End: 2017-03-03

## 2017-03-02 MED ORDER — PENICILLIN G POT IN DEXTROSE 60000 UNIT/ML IV SOLN
3.0000 10*6.[IU] | INTRAVENOUS | Status: DC
  Administered 2017-03-03: 3 10*6.[IU] via INTRAVENOUS
  Filled 2017-03-02 (×4): qty 50

## 2017-03-02 MED ORDER — TERBUTALINE SULFATE 1 MG/ML IJ SOLN
0.2500 mg | Freq: Once | INTRAMUSCULAR | Status: DC | PRN
  Filled 2017-03-02: qty 1

## 2017-03-02 MED ORDER — OXYCODONE-ACETAMINOPHEN 5-325 MG PO TABS
2.0000 | ORAL_TABLET | ORAL | Status: DC | PRN
  Administered 2017-03-03: 2 via ORAL
  Filled 2017-03-02: qty 2

## 2017-03-02 MED ORDER — ACETAMINOPHEN 325 MG PO TABS
650.0000 mg | ORAL_TABLET | ORAL | Status: DC | PRN
  Administered 2017-03-03: 650 mg via ORAL
  Filled 2017-03-02: qty 2

## 2017-03-02 MED ORDER — OXYTOCIN 40 UNITS IN LACTATED RINGERS INFUSION - SIMPLE MED
2.5000 [IU]/h | INTRAVENOUS | Status: DC
  Filled 2017-03-02: qty 1000

## 2017-03-02 MED ORDER — ONDANSETRON HCL 4 MG/2ML IJ SOLN: 4.0000 mg | Freq: Four times a day (QID) | INTRAMUSCULAR | Status: DC | PRN

## 2017-03-02 MED ORDER — PENICILLIN G POTASSIUM 5000000 UNITS IJ SOLR
5.0000 10*6.[IU] | Freq: Once | INTRAVENOUS | Status: AC
  Administered 2017-03-02: 5 10*6.[IU] via INTRAVENOUS
  Filled 2017-03-02: qty 5

## 2017-03-02 MED ORDER — OXYTOCIN BOLUS FROM INFUSION
500.0000 mL | Freq: Once | INTRAVENOUS | Status: AC
  Administered 2017-03-03: 500 mL via INTRAVENOUS

## 2017-03-02 MED ORDER — OXYCODONE-ACETAMINOPHEN 5-325 MG PO TABS: 1.0000 | ORAL_TABLET | ORAL | Status: DC | PRN

## 2017-03-02 MED ORDER — LACTATED RINGERS IV SOLN: 500.0000 mL | INTRAVENOUS | Status: DC | PRN

## 2017-03-02 MED ORDER — LACTATED RINGERS IV SOLN
INTRAVENOUS | Status: DC
  Administered 2017-03-02: 21:00:00 via INTRAVENOUS

## 2017-03-02 MED ORDER — LIDOCAINE HCL (PF) 1 % IJ SOLN
30.0000 mL | INTRAMUSCULAR | Status: DC | PRN
  Filled 2017-03-02 (×2): qty 30

## 2017-03-02 NOTE — MAU Provider Note (Signed)
S:  Pt here with positive rupture of membranes; Denies headache, vision changes, or epigastric pain.   O:   Exam:   Vitals:   03/02/17 2000 03/02/17 2015  BP: (!) 154/76 (!) 163/69  Pulse: (!) 103 (!) 109  Resp:    Temp:     General A&Ox3; no signs of acute distress, talking with family members.  OB Faculty staff en route to admit patient. PreX labs ordered.  Gwen Pounds, CNM

## 2017-03-02 NOTE — Anesthesia Pain Management Evaluation Note (Signed)
  CRNA Pain Management Visit Note  Patient: Cheryl Bowman, 31 y.o., female  "Hello I am a member of the anesthesia team at [facility]. We have an anesthesia team available at all times to provide care throughout the hospital, including epidural management and anesthesia for C-section. I don't know your plan for the delivery whether it a natural birth, water birth, IV sedation, nitrous supplementation, doula or epidural, but we want to meet your pain goals."   1.Was your pain managed to your expectations on prior hospitalizations?   Yes   2.What is your expectation for pain management during this hospitalization?     Labor support without medications and Epidural  3.How can we help you reach that goal? Epidural - last Lovenox 1630 today  Record the patient's initial score and the patient's pain goal.   Pain: 2  Pain Goal: 7 [facility] wants you to be able to say your pain was always managed very well.  Charlot Gouin 03/02/2017

## 2017-03-02 NOTE — MAU Note (Signed)
Water broke at 5 pm, clear fluid. No bleeding. Baby moving well. Not feeling contractions. Low back pain since noon and lost mucus plug.

## 2017-03-02 NOTE — H&P (Signed)
LABOR AND DELIVERY ADMISSION HISTORY AND PHYSICAL NOTE  Cheryl Bowman is a 31 y.o. female [phone] with IUP at [redacted]w[redacted]d by LMP/9wk sono presenting for PROM@1700 .  Patient states she is very nervous about delivery given complications in her past deliveries.  Patient was counseled about induction process and encouraged to discuss questions or concerns with staff. She reports positive fetal movement. She denies vaginal bleeding. Patient denies headache, vision changes, chest pain, shortness of breath, or RUQ pain.   Prenatal History/Complications: Gestational HTN H/o postpartum PE-on Lovenox 100mg  daily  Past Medical History: Past Medical History:  Diagnosis Date  . Anxiety   . Bronchitis   . Depression   . GERD (gastroesophageal reflux disease)   . Plantar fasciitis   . Pregnancy induced hypertension   . Psoriasis   . Pulmonary embolism ([place])   . Sinusitis, acute   History of gestational hypertension in 2 prior pregnancies History of pulmonary embolism after third pregnancy, tx'd with xarelto for six months  Past Surgical History: Past Surgical History:  Procedure Laterality Date  . APPENDECTOMY    . WISDOM TOOTH EXTRACTION      Obstetrical History: OB History    Gravida Para Term Preterm AB Living   4 3 3     3    SAB TAB Ectopic Multiple Live Births         0 3      Social History: Social History   Social History  . Marital status: Married    Spouse name: N/A  . Number of children: N/A  . Years of education: N/A   Social History Main Topics  . Smoking status: Current Every Day Smoker    Packs/day: 0.50    Types: Cigarettes  . Smokeless tobacco: Never Used  . Alcohol use No  . Drug use: No  . Sexual activity: Yes    Birth control/ protection: None   Other Topics Concern  . Not on file   Social History Narrative  . No narrative on file    Family History: Family History  Problem Relation Age of Onset  . Alcohol abuse Mother   . Hypertension Father    . Depression Father   . Alcohol abuse Father   . Hyperlipidemia Father   . Stroke Father   . Cancer Maternal Grandmother        lung  . Depression Paternal Grandmother   . Kidney disease Paternal Grandfather   . Heart disease Paternal Grandfather   . Heart murmur Son     Allergies: Allergies  Allergen Reactions  . Macrobid [Nitrofurantoin] Rash  . Sulfa Antibiotics Rash    Prescriptions Prior to Admission  Medication Sig Dispense Refill Last Dose  . DULoxetine (CYMBALTA) 30 MG capsule Take 1 capsule (30 mg total) by mouth daily. 30 capsule 5 03/01/2017 at Unknown time  . enoxaparin (LOVENOX) 100 MG/ML injection Inject 1 mL (100 mg total) into the skin daily. 30 mL 6 03/02/2017 at Unknown time  . esomeprazole (NEXIUM) 20 MG capsule Take 20 mg by mouth daily as needed (indigestion).    03/02/2017 at Unknown time  . Pediatric Multiple Vit-C-FA (FLINSTONES GUMMIES OMEGA-3 DHA PO) Take by mouth.   03/02/2017 at Unknown time  . ranitidine (ZANTAC) 75 MG tablet Take 75 mg by mouth 2 (two) times daily.   Past Week at Unknown time     Review of Systems   All systems reviewed and negative except as stated in HPI  Blood pressure (!) 154/76, pulse Marland Kitchen)  103, temperature 98.5 F (36.9 C), resp. rate 17, height 5\' 5"  (1.651 m), weight 91.1 kg (200 lb 12 oz), last menstrual period 06/11/2016, SpO2 100 %, unknown if currently breastfeeding. General appearance: alert, cooperative, appears stated age and no distress Lungs: clear to auscultation bilaterally Heart: regular rate and rhythm Abdomen: soft, non-tender; bowel sounds normal Extremities: No calf swelling or tenderness Presentation: cephalic Fetal monitoring: 150/mod/+ac/-dc Uterine activity: Irritability     Prenatal labs: ABO, Rh: B/Negative/-- (01/10 1416) Antibody: Negative (05/22 0911) Rubella: Immune RPR: Non Reactive (05/22 0911)  HBsAg: Negative (01/10 1416)  HIV:   Nonreactive GBS: Positive (07/26 1200)  2 hr Glucola:  Normal (78/137/92) Genetic screening:  Negative Anatomy US: Normal female  Prenatal Transfer Tool  Maternal Diabetes: No Genetic Screening: Normal Maternal Ultrasounds/Referrals: Normal Fetal Ultrasounds or other Referrals:  None Maternal Substance Abuse:  Yes:  Type: Smoker Significant Maternal Medications:  Meds include: Other: Cymbalta 30mg , Lovenox 100mg  Significant Maternal Lab Results: Lab values include: Group B Strep positive, Rh negative, negative antibodies  Results for orders placed or performed during the hospital encounter of 03/02/17 (from the past 24 hour(s))  Urinalysis, Routine w reflex microscopic   Collection Time: 03/02/17  7:34 PM  Result Value Ref Range   Color, Urine YELLOW YELLOW   APPearance CLEAR CLEAR   Specific Gravity, Urine 1.009 1.005 - 1.030   pH 6.0 5.0 - 8.0   Glucose, UA NEGATIVE NEGATIVE mg/dL   Hgb urine dipstick NEGATIVE NEGATIVE   Bilirubin Urine NEGATIVE NEGATIVE   Ketones, ur NEGATIVE NEGATIVE mg/dL   Protein, ur NEGATIVE NEGATIVE mg/dL   Nitrite NEGATIVE NEGATIVE   Leukocytes, UA NEGATIVE NEGATIVE  POCT fern test   Collection Time: 03/02/17  8:00 PM  Result Value Ref Range   POCT Fern Test Positive = ruptured amniotic membanes    Component     Latest Ref Rng & Units 03/02/2017  Protein Creatinine Ratio     0.00 - 0.15 mg/mgCre 0.15   CBC    Component Value Date/Time   WBC 10.7 (H) 03/02/2017 2040   RBC 4.57 03/02/2017 2040   HGB 13.2 03/02/2017 2040   HGB 13.3 12/25/2016 0911   HCT 37.9 03/02/2017 2040   HCT 39.0 12/25/2016 0911   PLT 236 03/02/2017 2040   PLT 237 12/25/2016 0911   MCV 82.9 03/02/2017 2040   MCV 84 12/25/2016 0911   MCH 28.9 03/02/2017 2040   MCHC 34.8 03/02/2017 2040   RDW 14.2 03/02/2017 2040   RDW 14.5 12/25/2016 0911   LYMPHSABS 1.8 02/28/2017 1402   MONOABS 0.2 02/28/2017 1402   EOSABS 0.0 02/28/2017 1402   BASOSABS 0.0 02/28/2017 1402   CMP     Component Value Date/Time   NA 135 02/28/2017  1402   K 3.3 (L) 02/28/2017 1402   CL 102 02/28/2017 1402   CO2 24 02/28/2017 1402   GLUCOSE 94 02/28/2017 1402   BUN <5 (L) 02/28/2017 1402   CREATININE 0.53 02/28/2017 1402   CALCIUM 9.2 02/28/2017 1402   PROT 7.2 02/28/2017 1402   ALBUMIN 3.3 (L) 02/28/2017 1402   AST 18 02/28/2017 1402   ALT 12 (L) 02/28/2017 1402   ALKPHOS 109 02/28/2017 1402   BILITOT 0.5 02/28/2017 1402   GFRNONAA >60 02/28/2017 1402   GFRAA >60 02/28/2017 1402     Patient Active Problem List   Diagnosis Date Noted  . Sebaceous cyst 11/06/2016  . Smoker 08/15/2016  . History of gestational hypertension 08/02/2016  .  Psoriatic arthritis ([place]) 08/02/2016  . Encounter for supervision of other normal pregnancy 08/02/2016  . Obstetrical pulmonary embolism, postpartum 03/29/2015  . Rh negative state in antepartum period 08/31/2014  . Depression with anxiety 08/30/2014    Assessment: Syndi Pua is a 31 y.o. G4P3003 at [redacted]w[redacted]d here for PROM@1700   #gHTN: PreE labs wnl, BP's elevated 140s/80s, one severe 160s/90s in MAU previously, will monitor and start magnesium if pressures in severe range #PE hx: Lovenox 100mg  daily, last taken 1630 today #Labor: Induction with pitocin as needed #Pain: IV pain meds prn, epidural when ready #FWB: Cat 1 #GBS: Positive (07/26 1200) #MOF: Breast #MOC: Mirena #Circ: Yes, inpatient  Aneta Mins, MD 03/02/2017, 8:11 PM  OB FELLOW HISTORY AND PHYSICAL ATTESTATION  I have seen and examined this patient; I agree with above documentation in the resident's note.  SVE 3cm/50/-3. Start Pitocin for IOL. Start PCN for GBS prophylaxis. GHTN: UPC 0.15, CBC/CMP wnl. Monitor BPs and for signs/symptoms of PreE.    Gailen Shelter, MD OB Fellow 03/03/2017

## 2017-03-03 ENCOUNTER — Encounter (HOSPITAL_COMMUNITY): Payer: Self-pay | Admitting: Certified Registered Nurse Anesthetist

## 2017-03-03 ENCOUNTER — Encounter (HOSPITAL_COMMUNITY): Payer: Self-pay

## 2017-03-03 DIAGNOSIS — O99824 Streptococcus B carrier state complicating childbirth: Secondary | ICD-10-CM

## 2017-03-03 DIAGNOSIS — O134 Gestational [pregnancy-induced] hypertension without significant proteinuria, complicating childbirth: Secondary | ICD-10-CM

## 2017-03-03 DIAGNOSIS — Z3A37 37 weeks gestation of pregnancy: Secondary | ICD-10-CM

## 2017-03-03 LAB — DIC (DISSEMINATED INTRAVASCULAR COAGULATION)PANEL
D-Dimer, Quant: 0.96 ug/mL-FEU — ABNORMAL HIGH (ref 0.00–0.50)
INR: 1.06
Platelets: 271 10*3/uL (ref 150–400)
Prothrombin Time: 13.8 seconds (ref 11.4–15.2)

## 2017-03-03 LAB — DIC (DISSEMINATED INTRAVASCULAR COAGULATION) PANEL
APTT: 28 s (ref 24–36)
FIBRINOGEN: 447 mg/dL (ref 210–475)
SMEAR REVIEW: NONE SEEN

## 2017-03-03 LAB — CBC
HEMATOCRIT: 36.1 % (ref 36.0–46.0)
HEMOGLOBIN: 12.2 g/dL (ref 12.0–15.0)
MCH: 28.2 pg (ref 26.0–34.0)
MCHC: 33.8 g/dL (ref 30.0–36.0)
MCV: 83.4 fL (ref 78.0–100.0)
Platelets: 271 10*3/uL (ref 150–400)
RBC: 4.33 MIL/uL (ref 3.87–5.11)
RDW: 14.2 % (ref 11.5–15.5)
WBC: 16.9 10*3/uL — ABNORMAL HIGH (ref 4.0–10.5)

## 2017-03-03 LAB — RPR: RPR Ser Ql: NONREACTIVE

## 2017-03-03 LAB — POSTPARTUM HEMORRHAGE PROTOCOL (BB NOTIFICATION)

## 2017-03-03 MED ORDER — SODIUM CHLORIDE 0.9% FLUSH: 9.0000 mL | INTRAVENOUS | Status: DC | PRN

## 2017-03-03 MED ORDER — DIPHENHYDRAMINE HCL 25 MG PO CAPS: 25.0000 mg | ORAL_CAPSULE | Freq: Four times a day (QID) | ORAL | Status: DC | PRN

## 2017-03-03 MED ORDER — DIBUCAINE 1 % RE OINT: 1.0000 "application " | TOPICAL_OINTMENT | RECTAL | Status: DC | PRN

## 2017-03-03 MED ORDER — MEASLES, MUMPS & RUBELLA VAC ~~LOC~~ INJ
0.5000 mL | INJECTION | Freq: Once | SUBCUTANEOUS | Status: DC
  Filled 2017-03-03: qty 0.5

## 2017-03-03 MED ORDER — ACETAMINOPHEN 325 MG PO TABS
650.0000 mg | ORAL_TABLET | ORAL | Status: DC | PRN
  Administered 2017-03-03 (×2): 650 mg via ORAL
  Filled 2017-03-03 (×2): qty 2

## 2017-03-03 MED ORDER — DIPHENHYDRAMINE HCL 12.5 MG/5ML PO ELIX
12.5000 mg | ORAL_SOLUTION | Freq: Four times a day (QID) | ORAL | Status: DC | PRN
  Filled 2017-03-03: qty 5

## 2017-03-03 MED ORDER — DIPHENHYDRAMINE HCL 50 MG/ML IJ SOLN: 12.5000 mg | Freq: Four times a day (QID) | INTRAMUSCULAR | Status: DC | PRN

## 2017-03-03 MED ORDER — MISOPROSTOL 200 MCG PO TABS
ORAL_TABLET | ORAL | Status: AC
  Filled 2017-03-03: qty 4

## 2017-03-03 MED ORDER — ONDANSETRON HCL 4 MG PO TABS: 4.0000 mg | ORAL_TABLET | ORAL | Status: DC | PRN

## 2017-03-03 MED ORDER — DULOXETINE HCL 30 MG PO CPEP
30.0000 mg | ORAL_CAPSULE | Freq: Every day | ORAL | Status: DC
  Administered 2017-03-03 – 2017-03-04 (×2): 30 mg via ORAL
  Filled 2017-03-03 (×3): qty 1

## 2017-03-03 MED ORDER — OXYTOCIN 10 UNIT/ML IJ SOLN
10.0000 [IU] | Freq: Once | INTRAMUSCULAR | Status: DC | PRN
  Filled 2017-03-03: qty 1

## 2017-03-03 MED ORDER — TETANUS-DIPHTH-ACELL PERTUSSIS 5-2.5-18.5 LF-MCG/0.5 IM SUSP: 0.5000 mL | Freq: Once | INTRAMUSCULAR | Status: DC

## 2017-03-03 MED ORDER — ZOLPIDEM TARTRATE 5 MG PO TABS: 5.0000 mg | ORAL_TABLET | Freq: Every evening | ORAL | Status: DC | PRN

## 2017-03-03 MED ORDER — OXYTOCIN 40 UNITS IN LACTATED RINGERS INFUSION - SIMPLE MED: 2.5000 [IU]/h | INTRAVENOUS | Status: DC

## 2017-03-03 MED ORDER — PRENATAL MULTIVITAMIN CH
1.0000 | ORAL_TABLET | Freq: Every day | ORAL | Status: DC
  Administered 2017-03-03 – 2017-03-04 (×2): 1 via ORAL
  Filled 2017-03-03 (×2): qty 1

## 2017-03-03 MED ORDER — CARBOPROST TROMETHAMINE 250 MCG/ML IM SOLN: 250.0000 ug | INTRAMUSCULAR | Status: DC | PRN

## 2017-03-03 MED ORDER — IBUPROFEN 600 MG PO TABS
600.0000 mg | ORAL_TABLET | Freq: Four times a day (QID) | ORAL | Status: DC
  Administered 2017-03-03: 600 mg via ORAL
  Filled 2017-03-03: qty 1

## 2017-03-03 MED ORDER — FENTANYL CITRATE (PF) 100 MCG/2ML IJ SOLN: 25.0000 ug | INTRAMUSCULAR | Status: DC | PRN

## 2017-03-03 MED ORDER — HYDROMORPHONE 1 MG/ML IV SOLN
INTRAVENOUS | Status: DC
  Administered 2017-03-03: 03:00:00 via INTRAVENOUS
  Filled 2017-03-03: qty 25

## 2017-03-03 MED ORDER — OXYTOCIN 40 UNITS IN LACTATED RINGERS INFUSION - SIMPLE MED
INTRAVENOUS | Status: AC
  Filled 2017-03-03: qty 1000

## 2017-03-03 MED ORDER — MISOPROSTOL 200 MCG PO TABS
800.0000 ug | ORAL_TABLET | Freq: Once | ORAL | Status: AC
  Administered 2017-03-03: 800 ug via ORAL
  Filled 2017-03-03: qty 4

## 2017-03-03 MED ORDER — CARBOPROST TROMETHAMINE 250 MCG/ML IM SOLN
INTRAMUSCULAR | Status: AC
  Filled 2017-03-03: qty 1

## 2017-03-03 MED ORDER — METHYLERGONOVINE MALEATE 0.2 MG/ML IJ SOLN: 0.2000 mg | Freq: Once | INTRAMUSCULAR | Status: DC

## 2017-03-03 MED ORDER — WITCH HAZEL-GLYCERIN EX PADS: 1.0000 "application " | MEDICATED_PAD | CUTANEOUS | Status: DC | PRN

## 2017-03-03 MED ORDER — SIMETHICONE 80 MG PO CHEW: 80.0000 mg | CHEWABLE_TABLET | ORAL | Status: DC | PRN

## 2017-03-03 MED ORDER — FENTANYL CITRATE (PF) 100 MCG/2ML IJ SOLN
100.0000 ug | Freq: Once | INTRAMUSCULAR | Status: AC
  Administered 2017-03-03: 100 ug via INTRAVENOUS

## 2017-03-03 MED ORDER — MISOPROSTOL 200 MCG PO TABS
ORAL_TABLET | ORAL | Status: AC
  Filled 2017-03-03: qty 2

## 2017-03-03 MED ORDER — BENZOCAINE-MENTHOL 20-0.5 % EX AERO
1.0000 "application " | INHALATION_SPRAY | CUTANEOUS | Status: DC | PRN
  Filled 2017-03-03: qty 56

## 2017-03-03 MED ORDER — OXYCODONE HCL 5 MG PO TABS
5.0000 mg | ORAL_TABLET | Freq: Four times a day (QID) | ORAL | Status: DC | PRN
  Administered 2017-03-03 – 2017-03-04 (×2): 5 mg via ORAL
  Filled 2017-03-03 (×2): qty 1

## 2017-03-03 MED ORDER — NALOXONE HCL 0.4 MG/ML IJ SOLN
0.4000 mg | INTRAMUSCULAR | Status: DC | PRN
  Filled 2017-03-03: qty 1

## 2017-03-03 MED ORDER — ONDANSETRON HCL 4 MG/2ML IJ SOLN: 4.0000 mg | INTRAMUSCULAR | Status: DC | PRN

## 2017-03-03 MED ORDER — METHYLERGONOVINE MALEATE 0.2 MG/ML IJ SOLN
INTRAMUSCULAR | Status: AC
  Administered 2017-03-03: 0.2 mg
  Filled 2017-03-03: qty 3

## 2017-03-03 MED ORDER — MISOPROSTOL 200 MCG PO TABS: 800.0000 ug | ORAL_TABLET | Freq: Once | ORAL | Status: DC

## 2017-03-03 MED ORDER — SENNOSIDES-DOCUSATE SODIUM 8.6-50 MG PO TABS: 2.0000 | ORAL_TABLET | ORAL | Status: DC

## 2017-03-03 MED ORDER — COCONUT OIL OIL: 1.0000 "application " | TOPICAL_OIL | Status: DC | PRN

## 2017-03-03 MED ORDER — FENTANYL CITRATE (PF) 100 MCG/2ML IJ SOLN
INTRAMUSCULAR | Status: AC
  Filled 2017-03-03: qty 2

## 2017-03-03 NOTE — Progress Notes (Signed)
   03/03/17 0715  Vital Signs  BP (!) 160/101  Pulse Rate (!) 52  Resp 20  Fundal Assessment  Fundal Tone (Dr. Elly Modena at bedside)  Lochia  Amount Moderate  Odor None  Clots Few;Large  Estimated Blood Loss 340  Bladder  Bladder Nondistended  Neurological  Neuro (WDL) WDL  Integumentary (WDL)  Integumentary (WDL) WDL  Pain Assessment  Pain Assessment 0-10  Pain Score 8  Pain Location Abdomen  Pain Orientation Lower  Pain Descriptors / Indicators Sore  Pain Intervention(s) Medication (See eMAR)   Code hemorrhage called at 0707 for stage 2.  PPH order set activated.  Dr. Elly Modena at bedside.  EBL in MBU was 979 plus 200cc from L&D which total to 1179cc.

## 2017-03-03 NOTE — Progress Notes (Signed)
I responded to Code Hemorrhage for this patient. Pt. Had spontaneous respirations, O2 SAT 100% on room air.RT services not required at this time, Lorin Picket acknowledged RT presence and I was excused from code.

## 2017-03-03 NOTE — Progress Notes (Signed)
LABOR PROGRESS NOTE  Cheryl Bowman is a 31 y.o. G4P3003 at [redacted]w[redacted]d  admitted for IOL 2/2 PROM  Subjective: Pt denies HA, RUQ pain, visual changes, SOB, new swelling  Objective: BP (!) 141/65 (BP Location: Left Arm)   Pulse 74   Temp 97.9 F (36.6 C) (Oral)   Resp 18   Ht 5\' 5"  (1.651 m)   Wt 91.1 kg (200 lb 12 oz)   LMP 06/11/2016   SpO2 100%   BMI 33.41 kg/m  or  Vitals:   03/02/17 2230 03/02/17 2300 03/02/17 2330 03/03/17 0000  BP: 134/70 (!) 148/70 140/86 (!) 141/65  Pulse: 81 73 98 74  Resp:    18  Temp:    97.9 F (36.6 C)  TempSrc:    Oral  SpO2:      Weight:      Height:        FHR 150/ mod/-acel/-dec- poor read Toco: ctx 4-5 minutes Dilation: 3 Effacement (%): 60 Station: -3 Presentation: Vertex Exam by:: Danielle Mink, MD  Labs: Lab Results  Component Value Date   WBC 10.7 (H) 03/02/2017   HGB 13.2 03/02/2017   HCT 37.9 03/02/2017   MCV 82.9 03/02/2017   PLT 236 03/02/2017    Patient Active Problem List   Diagnosis Date Noted  . PROM (premature rupture of membranes) 03/02/2017  . Gestational hypertension 03/02/2017  . Sebaceous cyst 11/06/2016  . Smoker 08/15/2016  . History of gestational hypertension 08/02/2016  . Psoriatic arthritis ([place]) 08/02/2016  . Encounter for supervision of other normal pregnancy 08/02/2016  . Obstetrical pulmonary embolism, postpartum 03/29/2015  . Rh negative state in antepartum period 08/31/2014  . Depression with anxiety 08/30/2014    Assessment / Plan: 31 y.o. G4P3003 at [redacted]w[redacted]d here for IOL 2/2 PROM  Labor: continue oxytocin BP: has not had any more severe pressures since last progress note. Still has elevated pressures. continue to monitor. One additional severe range pressure will prompt management of severe PreE Fetal Wellbeing:  cat1 Pain Control:  IV fentanyl, cannot have epidural until 1630 03/03/17 due to Lovenox/enoxaparin Anticipated MOD:  SVD  Rodney Booze,  03/03/2017, 12:52 AM  OB FELLOW MEDICAL  STUDENT NOTE ATTESTATION  I confirm that I have verified the information documented in the medical student's note and that I have also personally performed the physical exam and all medical decision making activities.  Pain not well-controlled. Will start PCA for pain control. IUPC placed.    Gailen Shelter, MD OB Fellow 03/03/2017, 3:10 AM

## 2017-03-03 NOTE — Progress Notes (Signed)
Set pt up with a DEBP per lactation. Taught how to set up, clean and how often to pump. Asked if she wanted to pump now and she refused.

## 2017-03-03 NOTE — Discharge Instructions (Signed)

## 2017-03-04 ENCOUNTER — Other Ambulatory Visit: Payer: BLUE CROSS/BLUE SHIELD

## 2017-03-04 ENCOUNTER — Encounter: Payer: BLUE CROSS/BLUE SHIELD | Admitting: Women's Health

## 2017-03-04 DIAGNOSIS — O134 Gestational [pregnancy-induced] hypertension without significant proteinuria, complicating childbirth: Secondary | ICD-10-CM

## 2017-03-04 DIAGNOSIS — O99824 Streptococcus B carrier state complicating childbirth: Secondary | ICD-10-CM

## 2017-03-04 LAB — CBC WITH DIFFERENTIAL/PLATELET
BASOS ABS: 0 10*3/uL (ref 0.0–0.1)
BASOS PCT: 0 %
Eosinophils Absolute: 0.3 10*3/uL (ref 0.0–0.7)
Eosinophils Relative: 3 %
HEMATOCRIT: 27.5 % — AB (ref 36.0–46.0)
Hemoglobin: 9.9 g/dL — ABNORMAL LOW (ref 12.0–15.0)
Lymphocytes Relative: 34 %
Lymphs Abs: 3.3 10*3/uL (ref 0.7–4.0)
MCH: 30.2 pg (ref 26.0–34.0)
MCHC: 36 g/dL (ref 30.0–36.0)
MCV: 83.8 fL (ref 78.0–100.0)
MONO ABS: 0.4 10*3/uL (ref 0.1–1.0)
Monocytes Relative: 4 %
NEUTROS ABS: 5.7 10*3/uL (ref 1.7–7.7)
Neutrophils Relative %: 59 %
PLATELETS: 193 10*3/uL (ref 150–400)
RBC: 3.28 MIL/uL — ABNORMAL LOW (ref 3.87–5.11)
RDW: 14.5 % (ref 11.5–15.5)
WBC: 9.7 10*3/uL (ref 4.0–10.5)

## 2017-03-04 MED ORDER — RHO D IMMUNE GLOBULIN 1500 UNIT/2ML IJ SOSY
300.0000 ug | PREFILLED_SYRINGE | Freq: Once | INTRAMUSCULAR | Status: AC
  Administered 2017-03-04: 300 ug via INTRAVENOUS
  Filled 2017-03-04: qty 2

## 2017-03-04 MED ORDER — FERROUS SULFATE 325 (65 FE) MG PO TABS: 325.0000 mg | ORAL_TABLET | Freq: Two times a day (BID) | ORAL | 3 refills | Status: DC

## 2017-03-04 MED ORDER — ENOXAPARIN SODIUM 40 MG/0.4ML ~~LOC~~ SOLN: 40.0000 mg | SUBCUTANEOUS | 1 refills | Status: DC

## 2017-03-04 MED ORDER — FERROUS SULFATE 325 (65 FE) MG PO TABS
325.0000 mg | ORAL_TABLET | Freq: Two times a day (BID) | ORAL | Status: DC
  Administered 2017-03-04: 325 mg via ORAL
  Filled 2017-03-04: qty 1

## 2017-03-04 MED ORDER — DOCUSATE SODIUM 100 MG PO CAPS: 100.0000 mg | ORAL_CAPSULE | Freq: Two times a day (BID) | ORAL | 2 refills | Status: DC

## 2017-03-04 MED ORDER — ENOXAPARIN SODIUM 40 MG/0.4ML ~~LOC~~ SOLN
40.0000 mg | SUBCUTANEOUS | Status: DC
  Administered 2017-03-04: 40 mg via SUBCUTANEOUS
  Filled 2017-03-04 (×2): qty 0.4

## 2017-03-04 NOTE — Lactation Note (Signed)
This note was copied from a baby's chart. Lactation Consultation Note  Patient Name: Boy Barbarita Hutmacher ZYSAY'T Date: 03/04/2017 Reason for consult: Initial assessment;NICU baby  NICU baby 87 hours old. Mom reports that she nursed her 29 year old for 6 months, but her next two children were "tongue-tied" so she was not able to latch them. Mom states that her milk came to volume with all 3 children. Mom states that she has not been pumping very much. Enc mom to pump every 2-3 hours for a total of 8-12 times/24 hours. Enc mom to hand express afterwards, and mom reports that she is comfortable with hand expression. Mom given [facility] brochure and NICU handouts with review. Mom reports that she has a personal Medela DEBP. Mom aware of benefits of hospital-grade pump and DEBP rental program. Enc mom to take pumping kit with her at D/C. Mom to call for assistance as needed.   Maternal Data Has patient been taught Hand Expression?: Yes Does the patient have breastfeeding experience prior to this delivery?: Yes  Feeding Feeding Type: Formula Nipple Type: Slow - flow Length of feed: 30 min  LATCH Score                   Interventions    Lactation Tools Discussed/Used Tools: Pump Breast pump type: Double-Electric Breast Pump Pump Review: Setup, frequency, and cleaning;Milk Storage Initiated by:: bedside RN Date initiated:: 03/03/17   Consult Status      Andres Labrum 03/04/2017, 11:14 AM

## 2017-03-04 NOTE — Discharge Summary (Signed)
OB Discharge Summary     Patient Name: Cheryl Bowman DOB: [birth year] MRN: [id number]  Date of admission: 03/02/2017 Delivering MD: Demetrios Isaacs A   Date of discharge: 03/04/2017  Admitting diagnosis: 95 WKS, WATER BROKE Intrauterine pregnancy: [redacted]w[redacted]d     Secondary diagnosis:  Principal Problem:   NSVD (normal spontaneous vaginal delivery) Active Problems:   Gestational hypertension  Additional problems: History of PE postpartum in previous pregnancy     Discharge diagnosis: Term Pregnancy Delivered                                                                                                Post partum procedures:none  Augmentation: Pitocin  Complications: [id number]  Hospital course:  Induction of Labor With Vaginal Delivery   31 y.o. yo [id number] at [redacted]w[redacted]d was admitted to the hospital 03/02/2017 for induction of labor.  Indication for induction: PROM and Gestational hypertension. Labor was induced with Pitocin and patient progressed quickly and had SVD with details as noted below. Membrane Rupture Time/Date: 5:00 PM ,03/02/2017   Intrapartum Procedures: Episiotomy: None [1]                                         Lacerations:  None [1]  Patient had delivery of a Viable infant.  Information for the patient's newborn:  Brad, Mcgaughy [[id number]]  Delivery Method: Vaginal, Spontaneous Delivery (Filed from Delivery Summary)   03/03/2017  Details of delivery can be found in separate delivery note. Initial EBL was 268ml, but she had delayed PPH, with a total EBL of 1194ml. PPH was managed with Pitocin, Cytotec, methergine and uterine massage. Her hemoglobin went from 13.1 to 12.2 to 9.9. She was asymptomatic. She was started on ferrous sulfate prior to discharge.   Patient was on Lovenox 100mg  daily during pregnancy d/t prior h/o PE postpartum. Lovenox was restarted over 18 hours postpartum d/t PPH as noted above, and it was started at only prophylaxis dose of 40 mg  daily.   Patient is discharged home 03/04/17.  Physical exam  Vitals:   03/03/17 1214 03/03/17 1615 03/03/17 1930 03/04/17 0605  BP: 134/62 (!) 130/59 137/84 125/74  Pulse: 75 77 78 73  Resp: 20 16 18 18   Temp: 98.7 F (37.1 C)  98.1 F (36.7 C) 97.7 F (36.5 C)  TempSrc: Oral  Oral Axillary  SpO2: 98%     Weight:      Height:       General: alert, cooperative and no distress Lochia: appropriate Uterine Fundus: firm Incision: N/A DVT Evaluation: No evidence of DVT seen on physical exam. No significant calf/ankle edema. Labs: Lab Results  Component Value Date   WBC 9.7 03/04/2017   HGB 9.9 (L) 03/04/2017   HCT 27.5 (L) 03/04/2017   MCV 83.8 03/04/2017   PLT 193 03/04/2017   CMP Latest Ref Rng & Units 03/02/2017  Glucose 65 - 99 mg/dL 80  BUN 6 - 20 mg/dL <5(L)  Creatinine 0.44 - 1.00 mg/dL 0.42(L)  Sodium 135 - 145 mmol/L 135  Potassium 3.5 - 5.1 mmol/L 3.9  Chloride 101 - 111 mmol/L 103  CO2 22 - 32 mmol/L 25  Calcium 8.9 - 10.3 mg/dL 8.9  Total Protein 6.5 - 8.1 g/dL 6.7  Total Bilirubin 0.3 - 1.2 mg/dL 0.6  Alkaline Phos 38 - 126 U/L 102  AST 15 - 41 U/L 19  ALT 14 - 54 U/L 9(L)    Discharge instruction: per After Visit Summary and "Baby and Me Booklet".  After visit meds:  Allergies as of 03/04/2017      Reactions   Macrobid [nitrofurantoin] Rash   Sulfa Antibiotics Rash      Medication List    TAKE these medications   aspirin-acetaminophen-caffeine 250-250-65 MG tablet Commonly known as:  EXCEDRIN MIGRAINE Take 2 tablets by mouth every 6 (six) hours as needed for headache.   docusate sodium 100 MG capsule Commonly known as:  COLACE Take 1 capsule (100 mg total) by mouth 2 (two) times daily.   DULoxetine 30 MG capsule Commonly known as:  CYMBALTA Take 1 capsule (30 mg total) by mouth daily. What changed:  when to take this   enoxaparin 40 MG/0.4ML injection Commonly known as:  LOVENOX Inject 0.4 mLs (40 mg total) into the skin daily. What  changed:  medication strength  how much to take  when to take this   esomeprazole 20 MG capsule Commonly known as:  NEXIUM Take 20-40 mg by mouth daily as needed (for acid reflux).   ferrous sulfate 325 (65 FE) MG tablet Take 1 tablet (325 mg total) by mouth 2 (two) times daily with a meal.   flintstones complete 60 MG chewable tablet Chew 2 tablets by mouth daily.   ranitidine 150 MG tablet Commonly known as:  ZANTAC Take 150 mg by mouth 2 (two) times daily as needed for heartburn.       Diet: routine diet  Activity: Advance as tolerated. Pelvic rest for 6 weeks.   Outpatient follow up:6 weeks  Postpartum contraception: IUD Mirena  Newborn Data: Live born female  Birth Weight: 6 lb 5.9 oz (2890 g) APGAR: 8, 9  Baby Feeding: Bottle Disposition:NICU   03/04/2017 Gailen Shelter, MD

## 2017-03-04 NOTE — Progress Notes (Signed)
UR chart review completed.  

## 2017-03-04 NOTE — Plan of Care (Signed)
Problem: Life Cycle: Goal: Risk for postpartum hemorrhage will decrease Outcome: Progressing Spoke with Dr. Enid Derry concerning repeat CBC this AM, received orders

## 2017-03-04 NOTE — Progress Notes (Addendum)
Verified Lovenox order with Dr Lambert Keto. Observed pt adminster Lovenox injection in abdomin. Pt states she understands injection process and use of lovenox and increase risk of bleeding. Pt is going to see infant in NICU, support is given.

## 2017-03-05 LAB — RH IG WORKUP (INCLUDES ABO/RH)
ABO/RH(D): B NEG
Fetal Screen: NEGATIVE
Gestational Age(Wks): 37.6
Unit division: 0

## 2017-03-06 LAB — BPAM RBC
Blood Product Expiration Date: 201808162359
Blood Product Expiration Date: 201808182359
Unit Type and Rh: 9500
Unit Type and Rh: 9500

## 2017-03-06 LAB — TYPE AND SCREEN
ABO/RH(D): B NEG
Antibody Screen: POSITIVE
DAT, IgG: NEGATIVE
UNIT DIVISION: 0
Unit division: 0

## 2017-03-07 ENCOUNTER — Telehealth: Payer: Self-pay | Admitting: Women's Health

## 2017-03-07 NOTE — Telephone Encounter (Signed)
Patient called stating that she has had a baby on Sunday and she was sent home on Monday. Patient states that she is in a lot of pain and would like something for it. Please contact pt

## 2017-03-07 NOTE — Telephone Encounter (Signed)
Patient called stating she is having intense cramping and back pain since her delivery. She had a postpartum hemorrhage as well. She is unable to take Ibuprofen due to taking Lovenox and is taking Tylenol around the clock with not much relief. She would like something stronger if possible. Please advise.

## 2017-03-08 ENCOUNTER — Other Ambulatory Visit: Payer: Self-pay | Admitting: Obstetrics & Gynecology

## 2017-03-08 MED ORDER — HYDROCODONE-ACETAMINOPHEN 5-325 MG PO TABS: 1.0000 | ORAL_TABLET | Freq: Four times a day (QID) | ORAL | 0 refills | Status: DC | PRN

## 2017-03-08 NOTE — Telephone Encounter (Signed)
Patient informed Hydrocodone prescribed but needs to pick up prescription before 2. States she will pick up.

## 2017-03-08 NOTE — Telephone Encounter (Signed)
Hydrocodone ordred, has to come pick up

## 2017-03-10 ENCOUNTER — Telehealth: Payer: Self-pay | Admitting: Obstetrics & Gynecology

## 2017-03-10 NOTE — Telephone Encounter (Signed)
[facility] OB[facility]N Attending Phone Call Documentation  I received a call from NICU today reporting that Zenaida Niece desired circumcision for her female infant that is currently in NICU, about to be discharged soon.  Patient had signed consent for procedure.  I called patient at home, circumcision procedure details discussed, risks and benefits of procedure were also discussed.  These include but are not limited to: Benefits of circumcision in men include reduction in the rates of urinary tract infection (UTI), penile cancer, some sexually transmitted infections, penile inflammatory and retractile disorders, as well as easier hygiene.  Risks include bleeding , infection, injury of glans which may lead to penile deformity or urinary tract issues, unsatisfactory cosmetic appearance and other potential complications related to the procedure.  It was emphasized that this is an elective procedure.  Patient wants to proceed with circumcision. Will do circumcision soon, routine circumcision and post circumcision care ordered for the infant.  Will call patient after procedure.    Verita Schneiders, M.D. 03/10/2017 1:49 PM

## 2017-03-18 DIAGNOSIS — Z029 Encounter for administrative examinations, unspecified: Secondary | ICD-10-CM

## 2017-04-18 ENCOUNTER — Ambulatory Visit: Payer: BLUE CROSS/BLUE SHIELD | Admitting: Women's Health

## 2017-04-22 ENCOUNTER — Ambulatory Visit (INDEPENDENT_AMBULATORY_CARE_PROVIDER_SITE_OTHER): Payer: BLUE CROSS/BLUE SHIELD | Admitting: Women's Health

## 2017-04-22 ENCOUNTER — Encounter: Payer: Self-pay | Admitting: Women's Health

## 2017-04-22 DIAGNOSIS — O8823 Thromboembolism in the puerperium: Secondary | ICD-10-CM

## 2017-04-22 DIAGNOSIS — F418 Other specified anxiety disorders: Secondary | ICD-10-CM | POA: Diagnosis not present

## 2017-04-22 DIAGNOSIS — Z862 Personal history of diseases of the blood and blood-forming organs and certain disorders involving the immune mechanism: Secondary | ICD-10-CM | POA: Diagnosis not present

## 2017-04-22 DIAGNOSIS — F4329 Adjustment disorder with other symptoms: Secondary | ICD-10-CM | POA: Diagnosis not present

## 2017-04-22 DIAGNOSIS — Z8759 Personal history of other complications of pregnancy, childbirth and the puerperium: Secondary | ICD-10-CM

## 2017-04-22 DIAGNOSIS — O8883 Other embolism in the puerperium: Secondary | ICD-10-CM | POA: Diagnosis not present

## 2017-04-22 LAB — POCT HEMOGLOBIN: Hemoglobin: 13.4 g/dL (ref 12.2–16.2)

## 2017-04-22 MED ORDER — DULOXETINE HCL 60 MG PO CPEP: 60.0000 mg | ORAL_CAPSULE | Freq: Every day | ORAL | 6 refills | Status: DC

## 2017-04-22 NOTE — Progress Notes (Signed)
[facility] Postpartum Visit  Patient name: Cheryl Bowman MRN [id number]  Date of birth: 1986-04-28 CC & HPI:  Cheryl Bowman is a 31 y.o. [phone] Caucasian female being seen today for a postpartum visit. She is 7 weeks postpartum following a spontaneous vaginal delivery at 37.6 gestational weeks, GHTN noted at time of admission. Anesthesia: none. I have fully reviewed the prenatal and intrapartum course. PPH of 1164ml>received cytotec, methergine. Hgb 13.1>12.2>9.9- asymptomatic, started on Fe at d/c. Postpartum course has been complicated by depression/anxiety. Pt states 'I need a psychiatrist, I've been traumatized' referring to the [facility]. Was on Lovenox during pregnancy d/t h/o pp PE. States she did not resume on d/c d/t PPH. Baby's course has been complicated by 1wk NICU stay d/t 'some pain medicine I got right before I had him'. Baby is feeding by bottle. Bleeding no bleeding. Bowel function is normal. Bladder function is normal. Patient is sexually active. Last sexual activity: 9/15. Contraception method is condoms and and wants IUD. Postpartum depression screening: positive. Score 14.  As above, feels traumatized from [facility]. Denies SI/HI/II. Was on cymbalta 30mg >increased it to 60mg - states that has helped. States husband supportive. Feels fine outside of medical facility/doctor's offices, etc. Last pap 09/20/16 and was neg.  Review of Systems:   Denies Abnormal vaginal discharge w/ itching/odor/irritation, headaches, visual changes, shortness of breath, chest pain, abdominal pain, severe nausea/vomiting, or problems with urination or bowel movements.  Pertinent History Reviewed:  Reviewed past medical,surgical and family history.  Reviewed problem list, medications and allergies.  OB History  Gravida Para Term Preterm AB Living  4 4 4     4   SAB TAB Ectopic Multiple Live Births        0 4    # Outcome Date GA Lbr Len/2nd Weight Sex Delivery Anes PTL Lv  4 Term 03/03/17 [redacted]w[redacted]d 10:40 /  00:04 6 lb 5.9 oz (2.89 kg) M Vag-Spont None  LIV  3 Term 03/05/15 [redacted]w[redacted]d 09:51 / 00:03 8 lb 3 oz (3.715 kg) F Vag-Spont EPI N LIV     Complications: Postpartum pulmonary embolism  2 Term 12/06/13 [redacted]w[redacted]d  6 lb 4 oz (2.835 kg) M Vag-Spont EPI N LIV  1 Term 05/24/06 [redacted]w[redacted]d  8 lb 4 oz (3.742 kg) F Vag-Spont EPI N LIV     Objective Findings:   Vitals:   04/22/17 1100  BP: 122/78  Pulse: (!) 109  Weight: 190 lb (86.2 kg)  Height: 5\' 6"  (1.676 m)    Body mass index is 30.67 kg/m. Patient's last menstrual period was 04/03/2017.  General:  alert, cooperative and no distress   Breasts:  deferred, no complaints  Lungs: clear to auscultation bilaterally  Heart:  regular rate and rhythm  Abdomen: soft, nontender   Vulva: normal  Vagina: normal vagina  Cervix:  closed  Corpus: Well-involuted  Adnexa:  Non-palpable  Rectal Exam: No hemorrhoids        Results for orders placed or performed in visit on 04/22/17 (from the past 24 hour(s))  POCT hemoglobin   Collection Time: 04/22/17 11:09 AM  Result Value Ref Range   Hemoglobin 13.4 12.2 - 16.2 g/dL    Assessment & Plan:  1) Postpartum exam, 7 wks s/p SVB 2) GHTN noted on admission in labor 3) PPH at time of birth 57) Bottlefeeding 5) Depression screening 6) Depression/anxiety, s/p traumatic experience w/ PPH> refilled cymbalta 60mg  daily, referral sent to [facility] for counseling/therapy 7) Contraception counseling, pt prefers IUD> last sex  9/15- pt prefers to do 14d from last sex, so will put her coming on 10/1 for IUD, no sex until after insertion  Return in about 2 weeks (around 05/06/2017) for IUD insertion.   Orders Placed This Encounter  Procedures  . Ambulatory referral to [facility]  . POCT hemoglobin    Tawnya Crook CNM, [facility] 04/22/2017 1:19 PM

## 2017-04-22 NOTE — Patient Instructions (Signed)
NO SEX UNTIL AFTER YOU GET YOUR BIRTH CONTROL   Levonorgestrel intrauterine device (IUD) What is this medicine? LEVONORGESTREL IUD (LEE voe nor jes trel) is a contraceptive (birth control) device. The device is placed inside the uterus by a healthcare professional. It is used to prevent pregnancy. This device can also be used to treat heavy bleeding that occurs during your period. This medicine may be used for other purposes; ask your health care provider or pharmacist if you have questions. COMMON BRAND NAME(S): Kyleena, LILETTA, Mirena, Skyla What should I tell my health care provider before I take this medicine? They need to know if you have any of these conditions: -abnormal Pap smear -cancer of the breast, uterus, or cervix -diabetes -endometritis -genital or pelvic infection now or in the past -have more than one sexual partner or your partner has more than one partner -heart disease -history of an ectopic or tubal pregnancy -immune system problems -IUD in place -liver disease or tumor -problems with blood clots or take blood-thinners -seizures -use intravenous drugs -uterus of unusual shape -vaginal bleeding that has not been explained -an unusual or allergic reaction to levonorgestrel, other hormones, silicone, or polyethylene, medicines, foods, dyes, or preservatives -pregnant or trying to get pregnant -breast-feeding How should I use this medicine? This device is placed inside the uterus by a health care professional. Talk to your pediatrician regarding the use of this medicine in children. Special care may be needed. Overdosage: If you think you have taken too much of this medicine contact a poison control center or emergency room at once. NOTE: This medicine is only for you. Do not share this medicine with others. What if I miss a dose? This does not apply. Depending on the brand of device you have inserted, the device will need to be replaced every 3 to 5 years if you  wish to continue using this type of birth control. What may interact with this medicine? Do not take this medicine with any of the following medications: -amprenavir -bosentan -fosamprenavir This medicine may also interact with the following medications: -aprepitant -armodafinil -barbiturate medicines for inducing sleep or treating seizures -bexarotene -boceprevir -griseofulvin -medicines to treat seizures like carbamazepine, ethotoin, felbamate, oxcarbazepine, phenytoin, topiramate -modafinil -pioglitazone -rifabutin -rifampin -rifapentine -some medicines to treat HIV infection like atazanavir, efavirenz, indinavir, lopinavir, nelfinavir, tipranavir, ritonavir -St. John's wort -warfarin This list may not describe all possible interactions. Give your health care provider a list of all the medicines, herbs, non-prescription drugs, or dietary supplements you use. Also tell them if you smoke, drink alcohol, or use illegal drugs. Some items may interact with your medicine. What should I watch for while using this medicine? Visit your doctor or health care professional for regular check ups. See your doctor if you or your partner has sexual contact with others, becomes HIV positive, or gets a sexual transmitted disease. This product does not protect you against HIV infection (AIDS) or other sexually transmitted diseases. You can check the placement of the IUD yourself by reaching up to the top of your vagina with clean fingers to feel the threads. Do not pull on the threads. It is a good habit to check placement after each menstrual period. Call your doctor right away if you feel more of the IUD than just the threads or if you cannot feel the threads at all. The IUD may come out by itself. You may become pregnant if the device comes out. If you notice that the IUD has come out   use a backup birth control method like condoms and call your health care provider. Using tampons will not change the  position of the IUD and are okay to use during your period. This IUD can be safely scanned with magnetic resonance imaging (MRI) only under specific conditions. Before you have an MRI, tell your healthcare provider that you have an IUD in place, and which type of IUD you have in place. What side effects may I notice from receiving this medicine? Side effects that you should report to your doctor or health care professional as soon as possible: -allergic reactions like skin rash, itching or hives, swelling of the face, lips, or tongue -fever, flu-like symptoms -genital sores -high blood pressure -no menstrual period for 6 weeks during use -pain, swelling, warmth in the leg -pelvic pain or tenderness -severe or sudden headache -signs of pregnancy -stomach cramping -sudden shortness of breath -trouble with balance, talking, or walking -unusual vaginal bleeding, discharge -yellowing of the eyes or skin Side effects that usually do not require medical attention (report to your doctor or health care professional if they continue or are bothersome): -acne -breast pain -change in sex drive or performance -changes in weight -cramping, dizziness, or faintness while the device is being inserted -headache -irregular menstrual bleeding within first 3 to 6 months of use -nausea This list may not describe all possible side effects. Call your doctor for medical advice about side effects. You may report side effects to FDA at 1-800-FDA-1088. Where should I keep my medicine? This does not apply. NOTE: This sheet is a summary. It may not cover all possible information. If you have questions about this medicine, talk to your doctor, pharmacist, or health care provider.  2018 Elsevier/Gold Standard (2016-05-04 14:14:56)  

## 2017-05-06 ENCOUNTER — Encounter: Payer: Self-pay | Admitting: *Deleted

## 2017-05-06 ENCOUNTER — Ambulatory Visit: Payer: BLUE CROSS/BLUE SHIELD | Admitting: Women's Health

## 2017-09-23 ENCOUNTER — Encounter: Payer: Self-pay | Admitting: Women's Health

## 2017-09-24 ENCOUNTER — Other Ambulatory Visit: Payer: Self-pay | Admitting: Women's Health

## 2017-09-24 DIAGNOSIS — F4310 Post-traumatic stress disorder, unspecified: Secondary | ICD-10-CM

## 2017-09-24 DIAGNOSIS — F418 Other specified anxiety disorders: Secondary | ICD-10-CM

## 2017-10-03 ENCOUNTER — Encounter: Payer: Self-pay | Admitting: Women's Health

## 2017-10-09 ENCOUNTER — Telehealth: Payer: Self-pay | Admitting: Women's Health

## 2017-10-09 DIAGNOSIS — F418 Other specified anxiety disorders: Secondary | ICD-10-CM

## 2017-10-09 DIAGNOSIS — F4310 Post-traumatic stress disorder, unspecified: Secondary | ICD-10-CM

## 2017-10-09 NOTE — Telephone Encounter (Signed)
Patient states she has called behavioral health in [place] and they have not received the referral.  Will send again.

## 2017-10-24 ENCOUNTER — Encounter: Payer: Self-pay | Admitting: Women's Health

## 2017-11-04 ENCOUNTER — Ambulatory Visit: Payer: BLUE CROSS/BLUE SHIELD | Admitting: Women's Health

## 2017-11-13 ENCOUNTER — Encounter: Payer: Self-pay | Admitting: Women's Health

## 2017-11-14 ENCOUNTER — Ambulatory Visit: Payer: BLUE CROSS/BLUE SHIELD | Admitting: Women's Health

## 2017-11-15 ENCOUNTER — Other Ambulatory Visit: Payer: Self-pay | Admitting: Women's Health

## 2017-11-15 MED ORDER — NORETHINDRONE 0.35 MG PO TABS: 1.0000 | ORAL_TABLET | Freq: Every day | ORAL | 11 refills | Status: DC

## 2017-11-21 ENCOUNTER — Ambulatory Visit: Payer: BLUE CROSS/BLUE SHIELD | Admitting: Women's Health

## 2017-12-18 ENCOUNTER — Ambulatory Visit (HOSPITAL_COMMUNITY): Payer: BLUE CROSS/BLUE SHIELD | Admitting: Psychiatry

## 2018-01-02 ENCOUNTER — Encounter

## 2018-01-02 ENCOUNTER — Encounter: Payer: Self-pay | Admitting: Family Medicine

## 2018-01-02 ENCOUNTER — Ambulatory Visit (INDEPENDENT_AMBULATORY_CARE_PROVIDER_SITE_OTHER): Payer: BLUE CROSS/BLUE SHIELD | Admitting: Family Medicine

## 2018-01-02 VITALS — BP 150/90 | HR 140 | Temp 97.9°F | Resp 18 | Ht 65.0 in | Wt 160.6 lb

## 2018-01-02 DIAGNOSIS — Z8759 Personal history of other complications of pregnancy, childbirth and the puerperium: Secondary | ICD-10-CM

## 2018-01-02 DIAGNOSIS — R109 Unspecified abdominal pain: Secondary | ICD-10-CM

## 2018-01-02 DIAGNOSIS — F339 Major depressive disorder, recurrent, unspecified: Secondary | ICD-10-CM

## 2018-01-02 DIAGNOSIS — R000 Tachycardia, unspecified: Secondary | ICD-10-CM | POA: Diagnosis not present

## 2018-01-02 DIAGNOSIS — F4310 Post-traumatic stress disorder, unspecified: Secondary | ICD-10-CM

## 2018-01-02 DIAGNOSIS — F17200 Nicotine dependence, unspecified, uncomplicated: Secondary | ICD-10-CM | POA: Diagnosis not present

## 2018-01-02 DIAGNOSIS — Z862 Personal history of diseases of the blood and blood-forming organs and certain disorders involving the immune mechanism: Secondary | ICD-10-CM | POA: Diagnosis not present

## 2018-01-02 DIAGNOSIS — L4050 Arthropathic psoriasis, unspecified: Secondary | ICD-10-CM

## 2018-01-02 MED ORDER — FLUOXETINE HCL 10 MG PO TABS: 10.0000 mg | ORAL_TABLET | Freq: Every day | ORAL | 3 refills | Status: DC

## 2018-01-02 MED ORDER — ALPRAZOLAM 0.5 MG PO TABS: 1.0000 mg | ORAL_TABLET | Freq: Two times a day (BID) | ORAL | 1 refills | Status: DC | PRN
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
## 2018-01-02 NOTE — Patient Instructions (Signed)
Preventive Care 18-39 Years, Female Preventive care refers to lifestyle choices and visits with your health care provider that can promote health and wellness. What does preventive care include?  A yearly physical exam. This is also called an annual well check.  Dental exams once or twice a year.  Routine eye exams. Ask your health care provider how often you should have your eyes checked.  Personal lifestyle choices, including: ? Daily care of your teeth and gums. ? Regular physical activity. ? Eating a healthy diet. ? Avoiding tobacco and drug use. ? Limiting alcohol use. ? Practicing safe sex. ? Taking vitamin and mineral supplements as recommended by your health care provider. What happens during an annual well check? The services and screenings done by your health care provider during your annual well check will depend on your age, overall health, lifestyle risk factors, and family history of disease. Counseling Your health care provider may ask you questions about your:  Alcohol use.  Tobacco use.  Drug use.  Emotional well-being.  Home and relationship well-being.  Sexual activity.  Eating habits.  Work and work Statistician.  Method of birth control.  Menstrual cycle.  Pregnancy history.  Screening You may have the following tests or measurements:  Height, weight, and BMI.  Diabetes screening. This is done by checking your blood sugar (glucose) after you have not eaten for a while (fasting).  Blood pressure.  Lipid and cholesterol levels. These may be checked every 5 years starting at age 66.  Skin check.  Hepatitis C blood test.  Hepatitis B blood test.  Sexually transmitted disease (STD) testing.  BRCA-related cancer screening. This may be done if you have a family history of breast, ovarian, tubal, or peritoneal cancers.  Pelvic exam and Pap test. This may be done every 3 years starting at age 40. Starting at age 59, this may be done every 5  years if you have a Pap test in combination with an HPV test.  Discuss your test results, treatment options, and if necessary, the need for more tests with your health care provider. Vaccines Your health care provider may recommend certain vaccines, such as:  Influenza vaccine. This is recommended every year.  Tetanus, diphtheria, and acellular pertussis (Tdap, Td) vaccine. You may need a Td booster every 10 years.  Varicella vaccine. You may need this if you have not been vaccinated.  HPV vaccine. If you are 69 or younger, you may need three doses over 6 months.  Measles, mumps, and rubella (MMR) vaccine. You may need at least one dose of MMR. You may also need a second dose.  Pneumococcal 13-valent conjugate (PCV13) vaccine. You may need this if you have certain conditions and were not previously vaccinated.  Pneumococcal polysaccharide (PPSV23) vaccine. You may need one or two doses if you smoke cigarettes or if you have certain conditions.  Meningococcal vaccine. One dose is recommended if you are age 27-21 years and a first-year college student living in a residence hall, or if you have one of several medical conditions. You may also need additional booster doses.  Hepatitis A vaccine. You may need this if you have certain conditions or if you travel or work in places where you may be exposed to hepatitis A.  Hepatitis B vaccine. You may need this if you have certain conditions or if you travel or work in places where you may be exposed to hepatitis B.  Haemophilus influenzae type b (Hib) vaccine. You may need this if  you have certain risk factors.  Talk to your health care provider about which screenings and vaccines you need and how often you need them. This information is not intended to replace advice given to you by your health care provider. Make sure you discuss any questions you have with your health care provider. Document Released: 09/18/2001 Document Revised: 04/11/2016  Document Reviewed: 05/24/2015 Elsevier Interactive Patient Education  [employer].

## 2018-01-03 LAB — COMPREHENSIVE METABOLIC PANEL
ALBUMIN: 4.2 g/dL (ref 3.5–5.2)
ALK PHOS: 70 U/L (ref 39–117)
ALT: 20 U/L (ref 0–35)
AST: 18 U/L (ref 0–37)
BILIRUBIN TOTAL: 0.4 mg/dL (ref 0.2–1.2)
BUN: 11 mg/dL (ref 6–23)
CALCIUM: 9.9 mg/dL (ref 8.4–10.5)
CO2: 24 mEq/L (ref 19–32)
CREATININE: 0.4 mg/dL (ref 0.40–1.20)
Chloride: 104 mEq/L (ref 96–112)
GFR: 198.79 mL/min (ref >60.00)
Glucose, Bld: 115 mg/dL — ABNORMAL HIGH (ref 70–99)
Potassium: 3.8 mEq/L (ref 3.5–5.1)
SODIUM: 138 meq/L (ref 135–145)
TOTAL PROTEIN: 7.3 g/dL (ref 6.0–8.3)

## 2018-01-03 LAB — CBC WITH DIFFERENTIAL/PLATELET
BASOS PCT: 0.4 % (ref 0.0–3.0)
Basophils Absolute: 0 10*3/uL (ref 0.0–0.1)
Eosinophils Absolute: 0.1 10*3/uL (ref 0.0–0.7)
Eosinophils Relative: 2.9 % (ref 0.0–5.0)
HEMATOCRIT: 43.3 % (ref 36.0–46.0)
HEMOGLOBIN: 14.9 g/dL (ref 12.0–15.0)
LYMPHS PCT: 35.4 % (ref 12.0–46.0)
Lymphs Abs: 1.8 10*3/uL (ref 0.7–4.0)
MCHC: 34.3 g/dL (ref 30.0–36.0)
MCV: 77.8 fl — ABNORMAL LOW (ref 78.0–100.0)
MONO ABS: 0.6 10*3/uL (ref 0.1–1.0)
Monocytes Relative: 10.9 % (ref 3.0–12.0)
Neutro Abs: 2.6 10*3/uL (ref 1.4–7.7)
Neutrophils Relative %: 50.4 % (ref 43.0–77.0)
Platelets: 266 10*3/uL (ref 150.0–400.0)
RBC: 5.56 Mil/uL — AB (ref 3.87–5.11)
RDW: 15 % (ref 11.5–15.5)
WBC: 5.1 10*3/uL (ref 4.0–10.5)

## 2018-01-03 LAB — SEDIMENTATION RATE: SED RATE: 10 mm/h (ref 0–20)

## 2018-01-03 LAB — TSH

## 2018-01-04 ENCOUNTER — Encounter: Payer: Self-pay | Admitting: Family Medicine

## 2018-01-04 ENCOUNTER — Encounter (HOSPITAL_BASED_OUTPATIENT_CLINIC_OR_DEPARTMENT_OTHER): Payer: Self-pay

## 2018-01-04 ENCOUNTER — Ambulatory Visit (HOSPITAL_BASED_OUTPATIENT_CLINIC_OR_DEPARTMENT_OTHER)
Admission: RE | Admit: 2018-01-04 | Discharge: 2018-01-04 | Disposition: A | Discharge status: Home / Self Care | Payer: BLUE CROSS/BLUE SHIELD | Source: Ambulatory Visit | Attending: Family Medicine | Admitting: Family Medicine

## 2018-01-04 DIAGNOSIS — R109 Unspecified abdominal pain: Secondary | ICD-10-CM

## 2018-01-05 ENCOUNTER — Other Ambulatory Visit: Payer: Self-pay | Admitting: Family Medicine

## 2018-01-05 DIAGNOSIS — R109 Unspecified abdominal pain: Secondary | ICD-10-CM | POA: Insufficient documentation

## 2018-01-05 DIAGNOSIS — R102 Pelvic and perineal pain unspecified side: Secondary | ICD-10-CM

## 2018-01-05 DIAGNOSIS — F4310 Post-traumatic stress disorder, unspecified: Secondary | ICD-10-CM | POA: Insufficient documentation

## 2018-01-05 DIAGNOSIS — R000 Tachycardia, unspecified: Secondary | ICD-10-CM | POA: Insufficient documentation

## 2018-01-05 DIAGNOSIS — R1030 Lower abdominal pain, unspecified: Secondary | ICD-10-CM

## 2018-01-05 NOTE — Assessment & Plan Note (Signed)
RRR today 

## 2018-01-05 NOTE — Assessment & Plan Note (Signed)
Encouraged complete cessation. Discussed need to quit as relates to risk of numerous cancers, cardiac and pulmonary disease as well as neurologic complications. Counseled for greater than 3 minutes 

## 2018-01-05 NOTE — Assessment & Plan Note (Signed)
She did not present for her 6 week post partum visit secondary to trauma and continues to have some intermittent lower abdominal will proceed with pelvic ultrasound

## 2018-01-05 NOTE — Assessment & Plan Note (Signed)
Have placed referral to rheumatology but they require old records from her previous rheumatologist

## 2018-01-05 NOTE — Assessment & Plan Note (Signed)
She reports having recurrent bad dreams, anxiety, depression, difficulty concentrating and panic attacks frequently since her traumatic birth experience. She is will to start counseling referral placed. Declines meds.

## 2018-01-06 ENCOUNTER — Other Ambulatory Visit: Payer: Self-pay | Admitting: Family Medicine

## 2018-01-06 DIAGNOSIS — R102 Pelvic and perineal pain: Secondary | ICD-10-CM

## 2018-01-07 ENCOUNTER — Ambulatory Visit (HOSPITAL_BASED_OUTPATIENT_CLINIC_OR_DEPARTMENT_OTHER)
Admission: RE | Admit: 2018-01-07 | Discharge: 2018-01-07 | Disposition: A | Discharge status: Home / Self Care | Payer: BLUE CROSS/BLUE SHIELD | Source: Ambulatory Visit | Attending: Family Medicine | Admitting: Family Medicine

## 2018-01-07 DIAGNOSIS — R1030 Lower abdominal pain, unspecified: Secondary | ICD-10-CM

## 2018-01-07 DIAGNOSIS — R102 Pelvic and perineal pain: Secondary | ICD-10-CM | POA: Diagnosis present

## 2018-01-07 DIAGNOSIS — D259 Leiomyoma of uterus, unspecified: Secondary | ICD-10-CM | POA: Insufficient documentation

## 2018-01-09 ENCOUNTER — Other Ambulatory Visit: Payer: Self-pay | Admitting: Family Medicine

## 2018-01-09 DIAGNOSIS — D219 Benign neoplasm of connective and other soft tissue, unspecified: Secondary | ICD-10-CM

## 2018-01-13 ENCOUNTER — Encounter: Payer: Self-pay | Admitting: Family Medicine

## 2018-01-13 ENCOUNTER — Encounter: Payer: Self-pay | Admitting: Obstetrics & Gynecology

## 2018-01-13 ENCOUNTER — Other Ambulatory Visit: Payer: Self-pay

## 2018-01-13 ENCOUNTER — Ambulatory Visit (INDEPENDENT_AMBULATORY_CARE_PROVIDER_SITE_OTHER): Payer: BLUE CROSS/BLUE SHIELD | Admitting: Obstetrics & Gynecology

## 2018-01-13 VITALS — BP 136/70 | HR 96 | Resp 16 | Ht 65.0 in | Wt 167.6 lb

## 2018-01-13 DIAGNOSIS — R102 Pelvic and perineal pain: Secondary | ICD-10-CM | POA: Diagnosis not present

## 2018-01-13 DIAGNOSIS — E049 Nontoxic goiter, unspecified: Secondary | ICD-10-CM

## 2018-01-13 DIAGNOSIS — D251 Intramural leiomyoma of uterus: Secondary | ICD-10-CM

## 2018-01-13 DIAGNOSIS — Z0001 Encounter for general adult medical examination with abnormal findings: Secondary | ICD-10-CM

## 2018-01-13 DIAGNOSIS — Z308 Encounter for other contraceptive management: Secondary | ICD-10-CM

## 2018-01-13 DIAGNOSIS — L4050 Arthropathic psoriasis, unspecified: Secondary | ICD-10-CM

## 2018-01-13 NOTE — Progress Notes (Signed)
GYNECOLOGY  VISIT  CC:   Pelvic/back pain, h/o post partum hemorrhage, possible post traumatic stress d/o  HPI: 32 y.o. [id number] Married Caucasian female here as new patient referred from Dr. Gwyneth Revels for further evaluation and management of pelvic/back pain, fibroids, and to help pt reestablish care after her last delivery.  Pt had a difficult delivery with her fourth child in 7/18.  Pt followed during pregnancy at [facility] ob/gyn but delivery was done by Dr. Elly Modena, [facility], which was a surprise to the patient.  She was on Lovenox due to hx of PE so could not receive an epidural.  Delivery complicated by a delayed post partum hemorrhage with about 1000cc EBL.  She was treated with pitocin, cytotec, methergine and uterine massage with manual extraction of clot.  Due to the presence of no epidural, she did feel everything regarding the manual extraction of clot.    Pt has many questions regarding this.  Was traumatized by this and was scared by it.  Did not understand during pregnancy she would not be able to receive an epidural until the day she went into the hospital.  Felt the Lovenox was the reason she had the [facility] but discussed with her other risk factors for PPH.  Pt reports she did not go back for her postpartum appt so she did not have the opportunity to have questions answered.  However, there is a not from [facility], [place], 04/22/17.    Pt was very tearful today during her visit.  Has been waiting almost 5 months to see a therapist at [facility].  Appt was made by [facility] ob/gyn for pt.  Still has several weeks until her apt.                         Pt feels she's had back and pelvic pain since her delivery that she feels was due to the manual extraction of clot.  She saw Dr. Gwyneth Revels on 01/05/18 and an ultrasound was ordered.  Ultrasound showed uterus measuring 7.8 x 4.2 x 6.4cm and there is a small probable fibroid noted measuring 1.1cm.  Pt also has  questions about this that were answered.    Pt would like to consider future pregnancy so does not want to do anything that could permanently change this possibility.  However, given delivery last year and emotional changes since then, she does not want another pregnancy at this time.  Has considered a Mirena IUD.  Not using any current contraception.  Has check about coverage.  Husband is on the police force.  They have been married for 12 years.  She feel he is supportive and relationship is good.  Last pap 09/20/16: negative.    GYNECOLOGIC HISTORY: Patient's last menstrual period was 01/01/2018. Contraception: none Menopausal hormone therapy: none Pap:  09/20/16  Patient Active Problem List   Diagnosis Date Noted  . Tachycardia 01/05/2018  . PTSD (post-traumatic stress disorder) 01/05/2018  . Abdominal pain 01/05/2018  . History of postpartum hemorrhage 04/22/2017  . Sebaceous cyst 11/06/2016  . Smoker 08/15/2016  . History of gestational hypertension 08/02/2016  . Psoriatic arthritis ([place]) 08/02/2016  . Obstetrical pulmonary embolism, postpartum 03/29/2015  . Rh negative state in antepartum period 08/31/2014  . Depression with anxiety 08/30/2014    Past Medical History:  Diagnosis Date  . Anxiety   . Bronchitis   . Depression   . GERD (gastroesophageal reflux disease)   .  Hemorrhage 02/2017  . Inclusion cyst of vulva   . Plantar fasciitis   . Postpartum hemorrhage   . Pregnancy induced hypertension   . Psoriasis   . Pulmonary embolism ([place]) 2016  . Sinusitis, acute     Past Surgical History:  Procedure Laterality Date  . APPENDECTOMY    . WISDOM TOOTH EXTRACTION      MEDS:   Current Outpatient Medications on File Prior to Visit  Medication Sig Dispense Refill  . ALPRAZolam (XANAX) 0.5 MG tablet Take 2 tablets (1 mg total) by mouth 2 (two) times daily as needed for anxiety. 45 tablet 1  . aspirin-acetaminophen-caffeine (EXCEDRIN MIGRAINE) 250-250-65 MG tablet  Take 2 tablets by mouth every 6 (six) hours as needed for headache.    . DULoxetine (CYMBALTA) 60 MG capsule Take 1 capsule (60 mg total) by mouth daily. 30 capsule 6  . FLUoxetine (PROZAC) 10 MG tablet Take 1 tablet (10 mg total) by mouth daily. 30 tablet 3   No current facility-administered medications on file prior to visit.     ALLERGIES: Macrobid [nitrofurantoin] and Sulfa antibiotics  Family History  Problem Relation Age of Onset  . Alcohol abuse Mother        recovered  . Hypertension Mother   . Hypertension Father   . Depression Father   . Alcohol abuse Father   . Hyperlipidemia Father   . Stroke Father   . Learning disabilities Father   . Cancer Maternal Grandmother        lung  . Other Maternal Grandmother        brain aneurysm  . Depression Paternal Grandmother        anxiety  . Arthritis Paternal Grandmother        rheumatoid  . Lung disease Paternal Grandmother   . Asthma Paternal Grandmother   . Heart disease Paternal Grandfather   . Kidney disease Paternal Grandfather   . Depression Sister   . Heart disease Sister   . Depression Daughter        molested   . Heart disease Daughter   . Heart murmur Son     SH:  Married, non smoker  Review of Systems  All other systems reviewed and are negative.   PHYSICAL EXAMINATION:    BP 136/70 (BP Location: Right Arm, Patient Position: Sitting, Cuff Size: Large)   Pulse 96   Resp 16   Ht 5\' 5"  (1.651 m)   Wt 167 lb 9.6 oz (76 kg)   LMP 01/01/2018   BMI 27.89 kg/m     General appearance: alert, cooperative and appears stated age Neck: no adenopathy, supple, symmetrical, trachea midline and thyroid enlarged and and mildly tender CV:  Tachycardic, no murmurs Lungs:  clear to auscultation, no wheezes, rales or rhonchi, symmetric air entry Abdomen: soft, non-tender; bowel sounds normal; no masses,  no organomegaly  Pelvic: External genitalia:  no lesions              Urethra:  normal appearing urethra with no  masses, tenderness or lesions              Bartholins and Skenes: normal                 Vagina: normal appearing vagina with normal color and discharge, no lesions              Cervix: no lesions              Bimanual Exam:  Uterus:  normal size, contour, position, consistency, mobility, non-tender              Adnexa: no mass, fullness, tenderness  Chaperone was present for exam.  Assessment: H/O post partum hemorrhage with possible post traumatic stress d/o or adjustment d/o resulting from this event Pelvic and back pain post partum Enlarged thyroid and low TSH Desires of contraception H/O PE, 2016 H/O psoriatic arthritis  Plan: Free T4, T3, Thyrotropin antibodies May need endocrinology referral Will contact [name] to see if pt can be seen sooner Pt knows to call with onset of menstrual cycle for IUD placement   ~90 minutes spent with patient >50% of time was in face to face discussion of above.

## 2018-01-14 ENCOUNTER — Other Ambulatory Visit: Payer: Self-pay | Admitting: Family Medicine

## 2018-01-14 ENCOUNTER — Telehealth: Payer: Self-pay | Admitting: *Deleted

## 2018-01-14 ENCOUNTER — Encounter: Payer: Self-pay | Admitting: Family Medicine

## 2018-01-14 LAB — T3: T3 TOTAL: 558 ng/dL — AB (ref 71–180)

## 2018-01-14 LAB — T4, FREE: Free T4: 3.42 ng/dL — ABNORMAL HIGH (ref 0.82–1.77)

## 2018-01-14 LAB — THYROTROPIN RECEPTOR AUTOABS: Thyrotropin Receptor Ab: 34.78 IU/L — ABNORMAL HIGH (ref 0.00–1.75)

## 2018-01-14 MED ORDER — METOPROLOL SUCCINATE ER 25 MG PO TB24: 25.0000 mg | ORAL_TABLET | Freq: Every day | ORAL | 2 refills | Status: DC

## 2018-01-14 NOTE — Telephone Encounter (Signed)
Message left to return call to Triage Nurse at [phone]. Please route to triage if I am unavailable.

## 2018-01-14 NOTE — Telephone Encounter (Signed)
Returning a call to [place]. Patient left a message on the answering machine.

## 2018-01-14 NOTE — Telephone Encounter (Signed)
Yes, I do.  I think she needs to see as soon as possible.  I can communicate with Dr. Randel Pigg as well if that would help.  Let me know please.

## 2018-01-14 NOTE — Telephone Encounter (Signed)
-----   Message from Megan Salon, MD sent at 01/14/2018  7:23 AM EDT ----- Please let pt know all of her thyroid tests are elevated esp the T3.  She needs to get in with PCP or endocrinology ASAP.  She sees Dr. Randel Pigg.  Can you please call about this today if possible.

## 2018-01-14 NOTE — Telephone Encounter (Signed)
Call to patient. Advised patient Dr. Sabra Heck feels she needs to be seen soon than 6-20. Patient agreeable. Patient states she is available any time for an appointment, "Cheryl Bowman can see me."

## 2018-01-14 NOTE — Telephone Encounter (Signed)
Call to Dr. Frederik Pear office, spoke with Juliann Pulse and stated that Dr. Sabra Heck would like patient to be seen prior to appointment on 01-23-18. Juliann Pulse states that schedule is limited for appointments with Dr. Charlett Blake prior to patient's scheduled appointment, but that she can send a message to Dr. Charlett Blake to see if patient can be seen sooner. RN stated she would route lab results to Dr. Charlett Blake as well. RN advised patient stated she could be seen whenever Dr. Charlett Blake could work her in. RN advised would update Dr. Sabra Heck. Dr. Frederik Pear office to return call with appointment response from Dr. Charlett Blake. Advised could speak with Triage Nurse.   Routing to provider for review.

## 2018-01-14 NOTE — Telephone Encounter (Unsigned)
Copied from [place] [id number]. Topic: Appointment Scheduling - Scheduling Inquiry for Clinic >> Jan 14, 2018  3:33 PM Scherrie Gerlach wrote: Reason for CRM: Raquel Sarna nurse with Dr Sabra Heck office called to ask if pt could be seen sooner by Dr Charlett Blake sooner than her 6/20 appt. Dr Sabra Heck states pt's tsh levels are "all over the chart" and wants her to be seen sooner. Raquel Sarna states she is out tomorrow, but ok to speak with triage nurse if you return call on wed.  Raquel Sarna states she is going to forward the results to Dr Charlett Blake and inform Dr Sabra Heck I will be sending a message to the dr as well

## 2018-01-14 NOTE — Telephone Encounter (Signed)
Spoke with patient. Advised patient RN had reached out to Dr. Frederik Pear office and are waiting for a return call with appointment time. Patient agreeable.

## 2018-01-14 NOTE — Telephone Encounter (Signed)
Returned call to patient. Message given to patient as seen below from Dr. Sabra Heck and patient verbalized understanding. Patient states that she is scheduled for follow up with Dr. Charlett Blake on 01-23-18. Patient asking if Dr. Sabra Heck thinks she needs to be seen sooner than that appointment? RN advised would review with Dr. Sabra Heck and would return call. Patient agreeable.   Routing to provider for review.

## 2018-01-15 ENCOUNTER — Telehealth: Payer: Self-pay | Admitting: Obstetrics & Gynecology

## 2018-01-15 NOTE — Telephone Encounter (Signed)
Patient sent the following correspondence through [place]. Routing to triage to assist patient with request.  ----- Message from Numa, Generic sent at 01/15/2018 4:05 PM EDT -----    I have an appt tomorrow at at 2pm I can take the appt for next week and I changed my p. Number [phone] thank you  ----- Message -----  From: Nurse Naaman Plummer  Sent: 01/15/2018 12:22 PM EDT  To: Zenaida Niece  Subject: Appointment  Rip Harbour,    Our office has attempted to reach you again this morning regarding a referral to [name] for counseling. Dr.Miller spoke with [name] and she has an opening tomorrow at 1 pm. If you are able to take this appointment please contact our office so that we can assist in scheduling. Almyra Free also has an appointment next week if tomorrow does not work well. Please contact our office to discuss this further.    Sincerely,    Reesa Chew, RN

## 2018-01-15 NOTE — Telephone Encounter (Signed)
Left message to call Sharee Pimple, RN triage at [facility] at [phone].

## 2018-01-15 NOTE — Telephone Encounter (Signed)
I communicated with Dr. Randel Pigg and she is going to do the referral to endocrinology.  She is going to start pt on medication and keep the follow-up appt as scheduled so she will know how much improvement she has after starting a beta-blocker.  Please let pt know this and to call when she starts her cycle for IUD placement.  I did have Suzy pre cert this and the iud is covered at 100%.

## 2018-01-15 NOTE — Telephone Encounter (Signed)
Attempted to reach patient x3 at number provided [phone], there was no answer and recording states that the number is no longer in service or has been disconnected. MyChart message sent to patient.

## 2018-01-15 NOTE — Telephone Encounter (Signed)
Left message to call Chelle Cayton at [phone].  

## 2018-01-15 NOTE — Telephone Encounter (Signed)
Attempted to reach patient 2 x again at number provided [phone], there was no answer and recording stating that the number is no longer in service or has been disconnected. Another MyChart message sent to patient as Dr.Miller has spoken with [name] who has a cancellation tomorrow at 1 pm and can see her or can see her next week.

## 2018-01-15 NOTE — Telephone Encounter (Signed)
[name] calling in f/u to [place] on 6/13 at 1pm. Advised our office has not received a return call from patient, attempted x5 and MyChart message sent. [name] will open her OV for 6/13 at 1pm, can offer patient 6/20 at 1pm. Return call to her office to check on availability if earlier appt needed.    Routing to Dr. Sabra Heck and K. Sprague, RN FYI.

## 2018-01-16 ENCOUNTER — Encounter: Payer: Self-pay | Admitting: "Endocrinology

## 2018-01-16 NOTE — Telephone Encounter (Signed)
Spoke with patient, advised of appointment details as seen below for [employer]. Patient provided contact information and address. Patient agreeable to date and time, thankful for assistance with scheduling.   Routing to provider for final review. Patient is agreeable to disposition. Will close encounter.

## 2018-01-16 NOTE — Telephone Encounter (Signed)
Left message to call Fleur Audino at [phone].  

## 2018-01-16 NOTE — Telephone Encounter (Signed)
Spoke with Cheryl Bowman at [place]. Patient scheduled with [name] on 01/23/18 at 1pm, [address] [place] location. Arrive 15 min early.

## 2018-01-16 NOTE — Telephone Encounter (Signed)
Please make sure pt does have app for next week 01/23/18 at 1pm.  Appt is not on appt desk yet.  Thanks for all of you help with this pt.

## 2018-01-16 NOTE — Telephone Encounter (Signed)
Cheryl Bowman has directly talked to [employer] office. Appointment is confirmed for 01-23-18. See next phone encounter.   Encounter closed.

## 2018-01-22 ENCOUNTER — Ambulatory Visit: Payer: Self-pay | Admitting: Licensed Clinical Social Worker

## 2018-01-23 ENCOUNTER — Ambulatory Visit (INDEPENDENT_AMBULATORY_CARE_PROVIDER_SITE_OTHER): Payer: BLUE CROSS/BLUE SHIELD | Admitting: Family Medicine

## 2018-01-23 ENCOUNTER — Encounter: Payer: Self-pay | Admitting: Family Medicine

## 2018-01-23 ENCOUNTER — Ambulatory Visit: Payer: BLUE CROSS/BLUE SHIELD | Admitting: Licensed Clinical Social Worker

## 2018-01-23 VITALS — BP 140/86 | HR 134 | Temp 98.2°F | Resp 18 | Wt 164.6 lb

## 2018-01-23 DIAGNOSIS — E010 Iodine-deficiency related diffuse (endemic) goiter: Secondary | ICD-10-CM | POA: Diagnosis not present

## 2018-01-23 DIAGNOSIS — F418 Other specified anxiety disorders: Secondary | ICD-10-CM | POA: Diagnosis not present

## 2018-01-23 DIAGNOSIS — R000 Tachycardia, unspecified: Secondary | ICD-10-CM

## 2018-01-23 DIAGNOSIS — E0590 Thyrotoxicosis, unspecified without thyrotoxic crisis or storm: Secondary | ICD-10-CM | POA: Diagnosis not present

## 2018-01-23 DIAGNOSIS — R7989 Other specified abnormal findings of blood chemistry: Secondary | ICD-10-CM

## 2018-01-23 DIAGNOSIS — M4120 Other idiopathic scoliosis, site unspecified: Secondary | ICD-10-CM

## 2018-01-23 DIAGNOSIS — F17200 Nicotine dependence, unspecified, uncomplicated: Secondary | ICD-10-CM

## 2018-01-23 NOTE — Patient Instructions (Signed)
To sleep can try taking up to 4 tabs of the Alprazolam at bedtime. Increase from 2 to 3 then if no response increase to 4  Sinus Tachycardia Sinus tachycardia is a kind of fast heartbeat. In sinus tachycardia, the heart beats more than 100 times a minute. Sinus tachycardia starts in a part of the heart called the sinus node. Sinus tachycardia may be harmless, or it may be a sign of a serious condition. What are the causes? This condition may be caused by:  Exercise or exertion.  A fever.  Pain.  Loss of body fluids (dehydration).  Severe bleeding (hemorrhage).  Anxiety and stress.  Certain substances, including: ? Alcohol. ? Caffeine. ? Tobacco and nicotine products. ? Diet pills. ? Illegal drugs.  Medical conditions including: ? Heart disease. ? An infection. ? An overactive thyroid (hyperthyroidism). ? A lack of red blood cells (anemia).  What are the signs or symptoms? Symptoms of this condition include:  A feeling that the heart is beating quickly (palpitations).  Suddenly noticing your heartbeat (cardiac awareness).  Dizziness.  Tiredness (fatigue).  Shortness of breath.  Chest pain.  Nausea.  Fainting.  How is this diagnosed? This condition is diagnosed with:  A physical exam.  Other tests, such as: ? Blood tests. ? An electrocardiogram (ECG). This test measures the electrical activity of the heart. ? Holter monitoring. For this test, you wear a device that records your heartbeat for one or more days.  You may be referred to a heart specialist (cardiologist). How is this treated? Treatment for this condition depends on the cause or underlying condition. Treatment may involve:  Treating the underlying condition.  Taking new medicines or changing your current medicines as told by your health care provider.  Making changes to your diet or lifestyle.  Practicing relaxation methods.  Follow these instructions at home: Lifestyle  Do not use  any products that contain nicotine or tobacco, such as cigarettes and e-cigarettes. If you need help quitting, ask your health care provider.  Learn relaxation methods, like deep breathing, to help you when you get stressed or anxious.  Do not use illegal drugs, such as cocaine.  Do not abuse alcohol. Limit alcohol intake to no more than 1 drink a day for non-pregnant women and 2 drinks a day for men. One drink equals 12 oz of beer, 5 oz of wine, or 1 oz of hard liquor.  Find time to rest and relax often. This reduces stress.  Avoid: ? Caffeine. ? Stimulants such as over-the-counter diet pills or pills that help you to stay awake. ? Situations that cause anxiety or stress. General instructions  Drink enough fluids to keep your urine clear or pale yellow.  Take over-the-counter and prescription medicines only as told by your health care provider.  Keep all follow-up visits as told by your health care provider. This is important. Contact a health care provider if:  You have a fever.  You have vomiting or diarrhea that keeps happening (is persistent). Get help right away if:  You have pain in your chest, upper arms, jaw, or neck.  You become weak or dizzy.  You feel faint.  You have palpitations that do not go away. This information is not intended to replace advice given to you by your health care provider. Make sure you discuss any questions you have with your health care provider. Document Released: 08/30/2004 Document Revised: 02/18/2016 Document Reviewed: 02/04/2015 Elsevier Interactive Patient Education  [employer].
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
## 2018-01-24 ENCOUNTER — Other Ambulatory Visit: Payer: Self-pay | Admitting: Family Medicine

## 2018-01-24 ENCOUNTER — Encounter: Payer: Self-pay | Admitting: Family Medicine

## 2018-01-24 DIAGNOSIS — Z862 Personal history of diseases of the blood and blood-forming organs and certain disorders involving the immune mechanism: Secondary | ICD-10-CM

## 2018-01-24 DIAGNOSIS — R7989 Other specified abnormal findings of blood chemistry: Secondary | ICD-10-CM

## 2018-01-24 DIAGNOSIS — E010 Iodine-deficiency related diffuse (endemic) goiter: Secondary | ICD-10-CM

## 2018-01-24 DIAGNOSIS — Z8759 Personal history of other complications of pregnancy, childbirth and the puerperium: Secondary | ICD-10-CM

## 2018-01-24 DIAGNOSIS — R000 Tachycardia, unspecified: Secondary | ICD-10-CM

## 2018-01-25 ENCOUNTER — Encounter: Payer: Self-pay | Admitting: Family Medicine

## 2018-01-25 ENCOUNTER — Encounter: Payer: Self-pay | Admitting: Obstetrics & Gynecology

## 2018-01-25 DIAGNOSIS — E0590 Thyrotoxicosis, unspecified without thyrotoxic crisis or storm: Secondary | ICD-10-CM

## 2018-01-25 DIAGNOSIS — M4120 Other idiopathic scoliosis, site unspecified: Secondary | ICD-10-CM | POA: Insufficient documentation

## 2018-01-25 HISTORY — DX: Thyrotoxicosis, unspecified without thyrotoxic crisis or storm: E05.90

## 2018-01-25 NOTE — Assessment & Plan Note (Signed)
Previously diagnosed and now with increasing pain in low back and hips. Will proceed with scoliosis xrays and consider further treatment

## 2018-01-25 NOTE — Assessment & Plan Note (Signed)
She has a radioactive iodine procedure schedule for a few days from now and a thyroid ultrasound is ordered today. She had not started the Metoprolol today but after further discussion agrees to do so.

## 2018-01-25 NOTE — Assessment & Plan Note (Signed)
Agrees to start her Metoprolol XL 25 mg tab daily

## 2018-01-25 NOTE — Assessment & Plan Note (Signed)
Has continued the Duloxetine and tolerated the addition of the 10 mg of Fluoxetine. Should consider working with behavioral health.

## 2018-01-25 NOTE — Assessment & Plan Note (Signed)
Needs complete cessation, patient is aware

## 2018-01-27 ENCOUNTER — Other Ambulatory Visit: Payer: Self-pay | Admitting: Family Medicine

## 2018-01-27 ENCOUNTER — Ambulatory Visit (HOSPITAL_BASED_OUTPATIENT_CLINIC_OR_DEPARTMENT_OTHER)
Admission: RE | Admit: 2018-01-27 | Discharge: 2018-01-27 | Disposition: A | Discharge status: Home / Self Care | Payer: BLUE CROSS/BLUE SHIELD | Source: Ambulatory Visit | Attending: Family Medicine | Admitting: Family Medicine

## 2018-01-27 ENCOUNTER — Encounter: Payer: Self-pay | Admitting: Family Medicine

## 2018-01-27 ENCOUNTER — Telehealth: Payer: Self-pay | Admitting: Obstetrics & Gynecology

## 2018-01-27 DIAGNOSIS — M4120 Other idiopathic scoliosis, site unspecified: Secondary | ICD-10-CM | POA: Diagnosis present

## 2018-01-27 DIAGNOSIS — E0590 Thyrotoxicosis, unspecified without thyrotoxic crisis or storm: Secondary | ICD-10-CM | POA: Insufficient documentation

## 2018-01-27 DIAGNOSIS — E010 Iodine-deficiency related diffuse (endemic) goiter: Secondary | ICD-10-CM | POA: Insufficient documentation

## 2018-01-27 DIAGNOSIS — E061 Subacute thyroiditis: Secondary | ICD-10-CM

## 2018-01-27 DIAGNOSIS — M4185 Other forms of scoliosis, thoracolumbar region: Secondary | ICD-10-CM | POA: Insufficient documentation

## 2018-01-27 DIAGNOSIS — R7989 Other specified abnormal findings of blood chemistry: Secondary | ICD-10-CM | POA: Diagnosis present

## 2018-01-27 NOTE — Telephone Encounter (Signed)
Responded to pt via mychart.  Ok to close encounter. 

## 2018-01-27 NOTE — Telephone Encounter (Signed)
Patient sent the following correspondence through [place]. Routing to provider to assist patient with request.  ----- Message from [place], Generic sent at 01/27/2018 9:47 AM EDT -----    She rescheduled for tomorrow at 1pm thanks for replying just wanted to let u know. I'm also not sure about the IUD I'm scared of the pain. I know I need it put in but can u please assure me that i won't feel it or something. I'm really struggling and I don't want to feel anymore pain.  ----- Message -----  From: Megan Salon, MD  Sent: 01/26/2018 7:10 AM EDT  To: Zenaida Niece  Subject: RE: Visit Follow-Up Question  Thank you for the message. I am very sorry about your friend. I know that is a difficult thing to understand why.    Almyra Free did call me to let me know you missed the appointment. She made some accommodations to see you after I personally called her after our initial visit. I do not know their policies about missing a first appointment. Please call and explain and hopefully she will be able to get you back into her office. I think, now more than even, you could benefit from her skill set.     Thanks again for the update and let me know if I can help. Thanks.    Edwinna Areola    ----- Message -----   From: Zenaida Niece   Sent: 01/25/2018 9:12 PM EDT    To: Megan Salon, MD  Subject: Visit Follow-Up Question    Hey Dr. Sabra Heck I missed my appt with julie whitt bc a friend of mine committed suicide...she shot herself in her head and left no reason why. So I was up all night and all day thinking how I could have helped her. and i tried to reschedule but all I got was a Advertising account executive. So I'm going to try again money when i finish my thyroid uptake test, back x-ray and thyroid ultrasound. Thanks so much for your help.

## 2018-01-28 ENCOUNTER — Ambulatory Visit: Payer: Self-pay | Admitting: Licensed Clinical Social Worker

## 2018-01-29 ENCOUNTER — Encounter: Payer: Self-pay | Admitting: Obstetrics & Gynecology

## 2018-01-30 ENCOUNTER — Telehealth: Payer: Self-pay | Admitting: Obstetrics & Gynecology

## 2018-01-30 ENCOUNTER — Encounter: Payer: Self-pay | Admitting: Family Medicine

## 2018-01-30 DIAGNOSIS — Z30430 Encounter for insertion of intrauterine contraceptive device: Secondary | ICD-10-CM

## 2018-01-30 NOTE — Telephone Encounter (Signed)
Spoke with patient. Requesting to proceed with Mirena IUD insertion discussed at 6/10 OV. LMP 01/30/18. Patient states she has been called with benefits.   Scheduled for IUD insertion on 01/31/18 at 10am with Dr. Sabra Heck. Advised to take Motrin 800 mg with food and water one hour before procedure. Patient verbalizes understanding.   Order placed for IUD insertion. Spoke with [place] D, benefits reviewed on 01/14/18.   Routing to provider for final review. Patient is agreeable to disposition. Will close encounter.  Cc: Magdalene Patricia

## 2018-01-30 NOTE — Telephone Encounter (Signed)
Patient is requesting to schedule IUD insertion.

## 2018-01-30 NOTE — Telephone Encounter (Signed)
Call to patient. Voice mail has name confirmation. Phone number is not the number listed on DPR. Left message to call back to triage nurse. No details left.

## 2018-01-30 NOTE — Telephone Encounter (Signed)
Patient returning call to triage nurse

## 2018-01-31 ENCOUNTER — Other Ambulatory Visit: Payer: Self-pay | Admitting: Family Medicine

## 2018-01-31 ENCOUNTER — Ambulatory Visit (INDEPENDENT_AMBULATORY_CARE_PROVIDER_SITE_OTHER): Payer: BLUE CROSS/BLUE SHIELD | Admitting: Obstetrics & Gynecology

## 2018-01-31 ENCOUNTER — Other Ambulatory Visit: Payer: Self-pay

## 2018-01-31 ENCOUNTER — Encounter: Payer: Self-pay | Admitting: Obstetrics & Gynecology

## 2018-01-31 VITALS — BP 138/76 | HR 106 | Resp 16 | Ht 65.0 in | Wt 164.2 lb

## 2018-01-31 DIAGNOSIS — Z01812 Encounter for preprocedural laboratory examination: Secondary | ICD-10-CM

## 2018-01-31 DIAGNOSIS — N898 Other specified noninflammatory disorders of vagina: Secondary | ICD-10-CM

## 2018-01-31 DIAGNOSIS — Z30430 Encounter for insertion of intrauterine contraceptive device: Secondary | ICD-10-CM | POA: Diagnosis not present

## 2018-01-31 DIAGNOSIS — Z3009 Encounter for other general counseling and advice on contraception: Secondary | ICD-10-CM

## 2018-01-31 LAB — POCT URINE PREGNANCY: PREG TEST UR: NEGATIVE

## 2018-01-31 MED ORDER — CEPHALEXIN 500 MG PO CAPS: 500.0000 mg | ORAL_CAPSULE | Freq: Four times a day (QID) | ORAL | 0 refills | Status: DC
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
## 2018-01-31 NOTE — Telephone Encounter (Signed)
Requesting: XANAX  0.5 MG Contract:- UDS:- Last OV: 01/23/18 Next OV: 02/25/18 Last Refill:01/02/18   Please advise

## 2018-02-01 ENCOUNTER — Encounter: Payer: Self-pay | Admitting: Family Medicine

## 2018-02-01 MED ORDER — ALPRAZOLAM 0.5 MG PO TABS: 1.0000 mg | ORAL_TABLET | Freq: Two times a day (BID) | ORAL | 1 refills | Status: DC | PRN

## 2018-02-01 NOTE — Telephone Encounter (Signed)
I refilled because I agreed but please let her know we need UDS and contract before next refill

## 2018-02-03 ENCOUNTER — Telehealth: Payer: Self-pay

## 2018-02-03 NOTE — Telephone Encounter (Signed)
Requesting: Alprazolam 0.5mg , 1-2mg  bid prn Contract: none found UDS: none found Last OV: 01/23/18 Next Ov: 02/25/18 Last refill: 02/01/18, #90, 1 RF Database: Discrepancies found, report printed and placed on Dr. Frederik Pear desk.   Request denied. Please advise for future refill requests.

## 2018-02-03 NOTE — Telephone Encounter (Signed)
PA initiated via Covermymeds; KEY: [id number]. Awaiting determination.

## 2018-02-04 ENCOUNTER — Encounter: Payer: Self-pay | Admitting: Obstetrics & Gynecology

## 2018-02-04 NOTE — Telephone Encounter (Signed)
Please advise 

## 2018-02-04 NOTE — Telephone Encounter (Signed)
PA approved.    CaseId:[id number];Status:Approved;Review Type:Qty;Coverage Start Date:01/04/2018;Coverage End Date:02/03/2019

## 2018-02-04 NOTE — Telephone Encounter (Signed)
A Prior Authorization was made and the 0.5 was approved she may have to wait for the PA to be approved before she can get the 1mg  if PCP agrees to the 1mg .       Dr. Charlett Blake patient is wanting 1mg  Xanax instead of the 0.5mg  do you agree with this please advise?

## 2018-02-04 NOTE — Telephone Encounter (Signed)
Pharmacy calling and states that the pt was wanting 1 mg Xanax tablets called in instead of the 0.5mg  and pt does not want to pick up the 0.5 tablets.

## 2018-02-04 NOTE — Addendum Note (Signed)
Addended by: Magdalene Molly A on: 02/04/2018 10:48 AM   Modules accepted: Orders

## 2018-02-05 ENCOUNTER — Telehealth: Payer: Self-pay | Admitting: Obstetrics & Gynecology

## 2018-02-05 ENCOUNTER — Encounter: Payer: Self-pay | Admitting: Obstetrics & Gynecology

## 2018-02-05 ENCOUNTER — Telehealth: Payer: Self-pay | Admitting: *Deleted

## 2018-02-05 NOTE — Telephone Encounter (Signed)
See previous phone encounter. Patient returned call to office at [place] has been addressed.

## 2018-02-05 NOTE — Telephone Encounter (Signed)
My Chart message from patient:  ----- Message from [place], Generic sent at 02/05/2018 3:48 PM EDT -----    Cheryl Bowman dr Benedict Needy make an appt with u Monday I'm gonna finish my antibiotics. I'm having a pinching in my stomach it's not the cyst my stitches are tight but I'm taking tylenol. They want me to see another dr. But I cant I'll wait for you to get back to check me out.

## 2018-02-05 NOTE — Telephone Encounter (Signed)
Patient called stating that she is has a fever and pain with her IUD .

## 2018-02-05 NOTE — Telephone Encounter (Signed)
Call to patient. Patient states that she took her temperature last night and it was 97.4. She just took her temperature again and it was 99. Patient states that she is having a pinching pain in her abdomen. States she was prescribed the keflex for "my incision, but this pain is in my abdomen, not my incision." Patient states when she takes the antibiotic late that is when her pain starts. Started the antibiotic Friday evening and was directed by Dr. Sabra Heck to take for 5 days. Denies pain at this time. States she took tylenol around 1300, but did not help. Patient also states she is having nausea and vomiting and only able to tolerate some crackers and soup. Patient also states, "tell her I've lost 3 pounds." RN advised would need to be seen and evaluated in office. Patient agreeable. Patient states she could be seen later this afternoon as her husband currently has her car. RN advised appointment available for 4pm with Dr. Talbert Nan. Patient abruptly declined stating, "oh no thank you! I don't trust anyone." Kandace Blitz, RN reached out to patient for follow up.

## 2018-02-05 NOTE — Telephone Encounter (Addendum)
Detailed message left for patient by Kandace Blitz, RN advising patient of the importance of being seen and evaluated today. Advised patient that Dr. Talbert Nan is Dr. Ammie Ferrier partner and is well trusted. Advised patient to please return call for appointment as soon as possible. Stated our office would be closed for the holiday tomorrow.   Routing to Dr. Talbert Nan, covering provider.   CC Dr. Sabra Heck
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
## 2018-02-06 NOTE — Telephone Encounter (Signed)
I had already sent a note suggesting she switch to the 1 mg. Please find that and proceed. Good idea

## 2018-02-07 ENCOUNTER — Telehealth: Payer: Self-pay | Admitting: Obstetrics and Gynecology

## 2018-02-07 ENCOUNTER — Encounter: Payer: Self-pay | Admitting: Obstetrics and Gynecology

## 2018-02-07 ENCOUNTER — Ambulatory Visit: Payer: BLUE CROSS/BLUE SHIELD | Admitting: Obstetrics and Gynecology

## 2018-02-07 NOTE — Telephone Encounter (Signed)
thanks

## 2018-02-07 NOTE — Telephone Encounter (Signed)
Sent in the 1mg  of Xanax.

## 2018-02-07 NOTE — Telephone Encounter (Signed)
Left message on voicemail regarding missed appointment.  °

## 2018-02-07 NOTE — Telephone Encounter (Signed)
Patient left a message through My Chart that she wanted to wait for Dr. Sabra Heck to return.  Encounter closed.

## 2018-02-07 NOTE — Addendum Note (Signed)
Addended by: Magdalene Molly A on: 02/07/2018 09:44 AM   Modules accepted: Orders
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
## 2018-02-10 ENCOUNTER — Ambulatory Visit (INDEPENDENT_AMBULATORY_CARE_PROVIDER_SITE_OTHER): Payer: BLUE CROSS/BLUE SHIELD | Admitting: Obstetrics & Gynecology

## 2018-02-10 ENCOUNTER — Encounter: Payer: Self-pay | Admitting: Obstetrics & Gynecology

## 2018-02-10 ENCOUNTER — Other Ambulatory Visit: Payer: Self-pay

## 2018-02-10 VITALS — BP 120/66 | HR 118 | Temp 98.5°F | Resp 16 | Ht 65.0 in | Wt 157.0 lb

## 2018-02-10 DIAGNOSIS — R509 Fever, unspecified: Secondary | ICD-10-CM

## 2018-02-10 DIAGNOSIS — Z30431 Encounter for routine checking of intrauterine contraceptive device: Secondary | ICD-10-CM

## 2018-02-10 LAB — CBC WITH DIFFERENTIAL/PLATELET
BASOS: 0 %
Basophils Absolute: 0 10*3/uL (ref 0.0–0.2)
EOS (ABSOLUTE): 0.2 10*3/uL (ref 0.0–0.4)
Eos: 4 %
HEMOGLOBIN: 13.3 g/dL (ref 11.1–15.9)
Hematocrit: 38.7 % (ref 34.0–46.6)
LYMPHS ABS: 1.8 10*3/uL (ref 0.7–3.1)
Lymphs: 39 %
MCH: 26.5 pg — ABNORMAL LOW (ref 26.6–33.0)
MCHC: 34.4 g/dL (ref 31.5–35.7)
MCV: 77 fL — ABNORMAL LOW (ref 79–97)
MONOS ABS: 0.5 10*3/uL (ref 0.1–0.9)
Monocytes: 10 %
NEUTROS PCT: 47 %
Neutrophils Absolute: 2.2 10*3/uL (ref 1.4–7.0)
PLATELETS: 227 10*3/uL (ref 150–450)
RBC: 5.02 x10E6/uL (ref 3.77–5.28)
RDW: 16.4 % — ABNORMAL HIGH (ref 12.3–15.4)
WBC: 4.6 10*3/uL (ref 3.4–10.8)

## 2018-02-10 NOTE — Progress Notes (Signed)
GYNECOLOGY  VISIT  CC:   Fever after procedure  HPI: 32 y.o. [id number] Married Caucasian female here for recheck after having inclusion cyst on the vulva removed on 01/31/18 as well as having a Mirena IUD placed that day.  Reports she had nausea and emesis after a child at home became sick.  Felt at first like this was a virus but this has continued.  Had temp of 101.7 this morning.  Is having spotting since IUD was placed.  Feels area where inclusion cyst was removed is not very tender.  Denies drainage.  Sutures are pulling a little.     GYNECOLOGIC HISTORY: Patient's last menstrual period was 01/30/2018. Contraception: Mirena IUD placed 01/31/18  Menopausal hormone therapy: none  Patient Active Problem List   Diagnosis Date Noted  . Hyperthyroidism 01/25/2018  . Idiopathic scoliosis 01/25/2018  . Tachycardia 01/05/2018  . PTSD (post-traumatic stress disorder) 01/05/2018  . Abdominal pain 01/05/2018  . History of postpartum hemorrhage 04/22/2017  . Sebaceous cyst 11/06/2016  . Smoker 08/15/2016  . History of gestational hypertension 08/02/2016  . Psoriatic arthritis ([place]) 08/02/2016  . Obstetrical pulmonary embolism, postpartum 03/29/2015  . Rh negative state in antepartum period 08/31/2014  . Depression with anxiety 08/30/2014    Past Medical History:  Diagnosis Date  . Anxiety   . Bronchitis   . Depression   . GERD (gastroesophageal reflux disease)   . Hemorrhage 02/2017  . Hyperthyroidism 01/25/2018  . Inclusion cyst of vulva   . Plantar fasciitis   . Postpartum hemorrhage   . Pregnancy induced hypertension   . Psoriasis   . Pulmonary embolism ([place]) 2016  . Sinusitis, acute     Past Surgical History:  Procedure Laterality Date  . APPENDECTOMY    . WISDOM TOOTH EXTRACTION      MEDS:   Current Outpatient Medications on File Prior to Visit  Medication Sig Dispense Refill  . aspirin-acetaminophen-caffeine (EXCEDRIN MIGRAINE) 250-250-65 MG tablet Take 2 tablets by  mouth every 6 (six) hours as needed for headache.    . DULoxetine (CYMBALTA) 60 MG capsule Take 1 capsule (60 mg total) by mouth daily. 30 capsule 6  . ibuprofen (ADVIL,MOTRIN) 200 MG tablet Take 200 mg by mouth every 6 (six) hours as needed.     No current facility-administered medications on file prior to visit.     ALLERGIES: Macrobid [nitrofurantoin] and Sulfa antibiotics  Family History  Problem Relation Age of Onset  . Alcohol abuse Mother        recovered  . Hypertension Mother   . Hypertension Father   . Depression Father   . Alcohol abuse Father   . Hyperlipidemia Father   . Stroke Father   . Learning disabilities Father   . Cancer Maternal Grandmother        lung  . Other Maternal Grandmother        brain aneurysm  . Depression Paternal Grandmother        anxiety  . Arthritis Paternal Grandmother        rheumatoid  . Lung disease Paternal Grandmother   . Asthma Paternal Grandmother   . Heart disease Paternal Grandfather   . Kidney disease Paternal Grandfather   . Depression Sister   . Heart disease Sister   . Depression Daughter        molested   . Heart disease Daughter   . Heart murmur Son     SH:  Married, non smoker  Review of Systems  Constitutional:  Positive for chills, fever and weight loss.  Eyes: Positive for redness.  Gastrointestinal: Positive for nausea and vomiting.  Musculoskeletal:       Muscle weakness   Neurological: Positive for headaches.  Psychiatric/Behavioral: The patient is nervous/anxious.   All other systems reviewed and are negative.   PHYSICAL EXAMINATION:    BP 120/66 (BP Location: Right Arm, Patient Position: Sitting, Cuff Size: Normal)   Pulse (!) 118   Temp 98.5 F (36.9 C) (Oral)   Resp 16   Ht 5\' 5"  (1.651 m)   Wt 157 lb (71.2 kg)   LMP 01/30/2018   BMI 26.13 kg/m     General appearance: alert, cooperative and appears stated age CV:  Tachycardic, no murmurs, rubs, gallops Lungs:  clear to auscultation, no  wheezes, rales or rhonchi, symmetric air entry  Pelvic: External genitalia:  no lesions              Urethra:  normal appearing urethra with no masses, tenderness or lesions              Bartholins and Skenes: normal                 Vagina: area of inclusion cyst healing well, no erythema or tenderness, two sutures removed               Cervix: no lesions and IUD string noted, dark spotting noted              Bimanual Exam:  Uterus:  normal size, contour, position, consistency, mobility, non-tender              Adnexa: no mass, fullness, tenderness  Chaperone was present for exam.  Assessment: S/p inclusion cyst removal and Mirena IUD placement 01/31/18 Fever that I do not think is related at her exam is normal Tachycardia likely related to Grave's disease  Plan: Will check CBC with diff today.  If normal, will have her follow up with her PCP

## 2018-02-11 ENCOUNTER — Other Ambulatory Visit: Payer: Self-pay

## 2018-02-11 MED ORDER — ALPRAZOLAM 1 MG PO TABS: ORAL_TABLET | ORAL | 0 refills | Status: DC

## 2018-02-12 ENCOUNTER — Ambulatory Visit (HOSPITAL_COMMUNITY): Payer: BLUE CROSS/BLUE SHIELD | Admitting: Psychiatry

## 2018-02-12 ENCOUNTER — Telehealth: Payer: Self-pay | Admitting: *Deleted

## 2018-02-12 NOTE — Telephone Encounter (Signed)
-----   Message from Megan Salon, MD sent at 02/12/2018  8:19 AM EDT ----- Please let pt know her WBC ct was normal and her hemoglobin was normal.  Also her platelet count was normal.  If her nausea and emesis continues, I will need to sent her to GI.  Please get an update from the pt.  Thanks.

## 2018-02-12 NOTE — Telephone Encounter (Signed)
Notes recorded by Burnice Logan, RN on 02/12/2018 at 9:31 AM EDT Spoke with patient, advised as seen below per Dr. Sabra Heck. Patient states nausea has resolved, still experiencing stomach pain after eating, running "low grade" temp. Patient declined referral to GI, has appointment with her PCP today for f/u of thyroid, will discuss further. Advised to return call office if any further assistance is needed. Patient verbalizes understanding and is agreeable. See telephone encounter dated 02/12/18 to update provider.    Routing to Dr. Lestine Box. Encounter closed.

## 2018-02-19 ENCOUNTER — Encounter: Payer: Self-pay | Admitting: "Endocrinology

## 2018-02-25 ENCOUNTER — Ambulatory Visit: Payer: BLUE CROSS/BLUE SHIELD | Admitting: Family Medicine

## 2018-02-25 DIAGNOSIS — Z0289 Encounter for other administrative examinations: Secondary | ICD-10-CM

## 2018-03-01 ENCOUNTER — Encounter: Payer: Self-pay | Admitting: Obstetrics & Gynecology

## 2018-03-05 ENCOUNTER — Ambulatory Visit (INDEPENDENT_AMBULATORY_CARE_PROVIDER_SITE_OTHER): Payer: BLUE CROSS/BLUE SHIELD | Admitting: Licensed Clinical Social Worker

## 2018-03-05 ENCOUNTER — Telehealth: Payer: Self-pay | Admitting: Obstetrics & Gynecology

## 2018-03-05 ENCOUNTER — Other Ambulatory Visit: Payer: Self-pay | Admitting: Obstetrics & Gynecology

## 2018-03-05 DIAGNOSIS — F4310 Post-traumatic stress disorder, unspecified: Secondary | ICD-10-CM | POA: Diagnosis not present

## 2018-03-05 DIAGNOSIS — E0500 Thyrotoxicosis with diffuse goiter without thyrotoxic crisis or storm: Secondary | ICD-10-CM

## 2018-03-05 NOTE — Telephone Encounter (Signed)
Routing to Dr. Miller FYI.   Encounter closed.  

## 2018-03-05 NOTE — Telephone Encounter (Signed)
Message   ----- Message from [place], Generic sent at 03/05/2018 6:56 AM EDT -----    Thanks so much and I won't miss it. It's the only way I can go back to work.  ----- Message -----  From: Megan Salon, MD  Sent: 03/05/2018 6:32 AM EDT  To: Cheryl Bowman  Subject: RE: Non-Urgent Medical Question  I have referred you to Dr. Benjiman Core. She is an endocrinologist. I really think you should be seeing an endocrinologist. There is a lack of this specialty in our area so once we have the initial appointment, it is very important to not miss it. My referral coordinator will be back in touch about this appt. Thanks for asking.    Edwinna Areola    ----- Message -----   From: Cheryl Bowman   Sent: 03/04/2018 4:27 PM EDT    To: Megan Salon, MD  Subject: RE: Non-Urgent Medical Question    I seen [facility] they referred me to [facility] for a thyroid uptake text and they determined it was graves disease just like you said. Unfortunately I do not have an endocrinologist yet do you know anybody that can take me? [facility] sent me to [facility] and they won't help me because I don't have the money to pay my deductible upfront.  ----- Message -----  From: Megan Salon, MD  Sent: 03/03/2018 12:21 AM EDT  To: Cheryl Bowman  Subject: RE: Non-Urgent Medical Question  Who did you end up seeing, if you don't mind sharing that with me? I cannot see any recent notes in Epic except for mind. Thanks for the update.    Edwinna Areola    ----- Message -----   From: Cheryl Bowman   Sent: 03/01/2018 6:05 AM EDT    To: Megan Salon, MD  Subject: Non-Urgent Medical Question    I just wanted to let you know that is was confirmed that it is graves disease and thank you so much for finding it.

## 2018-03-13 ENCOUNTER — Telehealth: Payer: Self-pay | Admitting: Obstetrics & Gynecology

## 2018-03-13 ENCOUNTER — Encounter: Payer: Self-pay | Admitting: Obstetrics & Gynecology

## 2018-03-13 NOTE — Telephone Encounter (Signed)
Patient sent the following correspondence through [place]. Routing to provider for FYI only.   Hey dr. Sabra Heck I have an appointment with dr. Philemon Kingdom on October 9th.

## 2018-03-28 ENCOUNTER — Ambulatory Visit: Payer: BLUE CROSS/BLUE SHIELD | Admitting: Obstetrics & Gynecology

## 2018-03-28 ENCOUNTER — Encounter: Payer: Self-pay | Admitting: "Endocrinology

## 2018-03-28 ENCOUNTER — Encounter: Payer: Self-pay | Admitting: Obstetrics & Gynecology

## 2018-03-28 ENCOUNTER — Telehealth: Payer: Self-pay | Admitting: Obstetrics & Gynecology

## 2018-03-28 NOTE — Telephone Encounter (Signed)
Patient did not keep her appointment with Dr. Sabra Heck today for an 8 week recheck. I called and spoke with her and rescheduled the appointment for 04/11/18 at 2:30. Patient said she was at the hospital getting tested for Graves Disease and forgot about the appointment.

## 2018-04-08 ENCOUNTER — Ambulatory Visit: Payer: Self-pay | Admitting: Licensed Clinical Social Worker

## 2018-04-11 ENCOUNTER — Encounter: Payer: Self-pay | Admitting: Obstetrics & Gynecology

## 2018-04-11 ENCOUNTER — Ambulatory Visit (INDEPENDENT_AMBULATORY_CARE_PROVIDER_SITE_OTHER): Payer: BLUE CROSS/BLUE SHIELD | Admitting: Obstetrics & Gynecology

## 2018-04-11 VITALS — BP 116/68 | HR 110 | Resp 16 | Ht 65.0 in | Wt 145.4 lb

## 2018-04-11 DIAGNOSIS — E0500 Thyrotoxicosis with diffuse goiter without thyrotoxic crisis or storm: Secondary | ICD-10-CM | POA: Diagnosis not present

## 2018-04-11 DIAGNOSIS — Z30431 Encounter for routine checking of intrauterine contraceptive device: Secondary | ICD-10-CM | POA: Diagnosis not present

## 2018-04-11 NOTE — Progress Notes (Signed)
GYNECOLOGY  VISIT  CC:   IUD recheck  HPI: 32 y.o. [id number] Married Caucasian female here for IUD check.  Denies cramping.  Bleeding in July and August was longer than typical cycle.  Flow lasts about 10 days.  It is heavier for about three days and then it becomes more dark flow.  She is very pleased that her cramping has resolved.  Having some cramping with intercourse.    Denies urinary symptoms.    Sister in law moved in with pt and her family in June.  This has helped her a lot.  Sister and law has a 51 year old.  He is under-vaccinated.  This makes her nervous because she has a child <2.    Has appt in October with Dr. Cruzita Lederer.  Having some issues with eyes and headaches that she feels is due to the Grave's disease.  Stopped her metoprolol.  States she feels better off of it.  Is aware this is not recommended.  Desires Dr. Cruzita Lederer to make recommendations.   GYNECOLOGIC HISTORY: Patient's last menstrual period was 03/24/2018 (approximate). Contraception: IUD -Mirena placed 01/31/18 Menopausal hormone therapy: none  Patient Active Problem List   Diagnosis Date Noted  . Hyperthyroidism 01/25/2018  . Idiopathic scoliosis 01/25/2018  . Tachycardia 01/05/2018  . PTSD (post-traumatic stress disorder) 01/05/2018  . Abdominal pain 01/05/2018  . History of postpartum hemorrhage 04/22/2017  . Sebaceous cyst 11/06/2016  . Smoker 08/15/2016  . History of gestational hypertension 08/02/2016  . Psoriatic arthritis ([place]) 08/02/2016  . Obstetrical pulmonary embolism, postpartum 03/29/2015  . Rh negative state in antepartum period 08/31/2014  . Depression with anxiety 08/30/2014    Past Medical History:  Diagnosis Date  . Anxiety   . Bronchitis   . Depression   . GERD (gastroesophageal reflux disease)   . Hemorrhage 02/2017  . Hyperthyroidism 01/25/2018  . Inclusion cyst of vulva   . Plantar fasciitis   . Postpartum hemorrhage   . Pregnancy induced hypertension   . Psoriasis   .  Pulmonary embolism ([place]) 2016  . Sinusitis, acute     Past Surgical History:  Procedure Laterality Date  . APPENDECTOMY    . WISDOM TOOTH EXTRACTION      MEDS:   Current Outpatient Medications on File Prior to Visit  Medication Sig Dispense Refill  . ALPRAZolam (XANAX) 1 MG tablet Take 1/2 to 2 tab po bid prn anxiety insomnia 90 tablet 0  . aspirin-acetaminophen-caffeine (EXCEDRIN MIGRAINE) 250-250-65 MG tablet Take 2 tablets by mouth every 6 (six) hours as needed for headache.    . ibuprofen (ADVIL,MOTRIN) 200 MG tablet Take 200 mg by mouth every 6 (six) hours as needed.     No current facility-administered medications on file prior to visit.     ALLERGIES: Macrobid [nitrofurantoin] and Sulfa antibiotics  Family History  Problem Relation Age of Onset  . Alcohol abuse Mother        recovered  . Hypertension Mother   . Hypertension Father   . Depression Father   . Alcohol abuse Father   . Hyperlipidemia Father   . Stroke Father   . Learning disabilities Father   . Cancer Maternal Grandmother        lung  . Other Maternal Grandmother        brain aneurysm  . Depression Paternal Grandmother        anxiety  . Arthritis Paternal Grandmother        rheumatoid  . Lung disease Paternal  Grandmother   . Asthma Paternal Grandmother   . Heart disease Paternal Grandfather   . Kidney disease Paternal Grandfather   . Depression Sister   . Heart disease Sister   . Depression Daughter        molested   . Heart disease Daughter   . Heart murmur Son     SH:  Married, smoker  Review of Systems  Constitutional: Positive for weight loss.  HENT: Positive for hearing loss.   Cardiovascular: Positive for palpitations.       Heart murmur  Gastrointestinal: Positive for diarrhea, nausea and vomiting.  Genitourinary:       Menstrual cycle changes Loss of sexual interest  Pain or bleeding with intercourse   Musculoskeletal: Positive for myalgias.       Muscle weakness   Skin:  Positive for itching.       Hair loss   Neurological: Positive for headaches.  Endo/Heme/Allergies:       Craving ice   Psychiatric/Behavioral: The patient is nervous/anxious.   All other systems reviewed and are negative.   PHYSICAL EXAMINATION:    BP 116/68 (BP Location: Right Arm, Patient Position: Sitting, Cuff Size: Normal)   Pulse (!) 110   Resp 16   Ht 5\' 5"  (1.651 m)   Wt 145 lb 6.4 oz (66 kg)   LMP 03/24/2018 (Approximate)   BMI 24.20 kg/m     General appearance: alert, cooperative and appears stated age Lymph:  no inguinal LAD noted  Pelvic: External genitalia:  no lesions              Urethra:  normal appearing urethra with no masses, tenderness or lesions              Bartholins and Skenes: normal                 Vagina: normal appearing vagina with normal color and discharge, no lesions              Cervix: no lesions and 2cm IUD string noted              Bimanual Exam:  Uterus:  normal size, contour, position, consistency, mobility, non-tender              Adnexa: no mass, fullness, tenderness  Chaperone was present for exam.  Assessment: Mirena IUD, being used for contraception H/O PE 2016 Grave's disease, not yet under control  Plan: Pt is welcome to return for any issues/concerns.  Will need IUD removed no later than 02/01/2023. Encouraged keeping appt with Dr. Cruzita Lederer   ~15 minutes spent with patient >50% of time was in face to face discussion of above.

## 2018-05-14 ENCOUNTER — Ambulatory Visit: Payer: Self-pay | Admitting: Internal Medicine

## 2018-05-15 ENCOUNTER — Ambulatory Visit: Payer: BLUE CROSS/BLUE SHIELD | Admitting: "Endocrinology

## 2018-05-30 ENCOUNTER — Ambulatory Visit: Payer: Self-pay | Admitting: Internal Medicine

## 2018-05-30 NOTE — Progress Notes (Deleted)
Patient ID: Cheryl Bowman, female   DOB: 06-23-1986, 32 y.o.   MRN: [id number]    HPI  Cheryl Bowman is a 32 y.o.-year-old female, referred by her PCP, Dr. Maryellen Pile, for evaluation for thyrotoxicosis, recently diagnosed as Graves' disease.  Patient was diagnosed with thyrotoxicosis in 12/2017.  Thyrotropin receptor antibodies were positive soon after.  She saw Dr. Dorris Fetch in 02/2018 and he ordered a thyroid uptake and scan which was positive for Graves' disease.  I reviewed pt's thyroid tests: 03/28/2018: TSH <0.006 Lab Results  Component Value Date   TSH <0.01 Repeated and verified X2. (L) 01/02/2018   FREET4 3.42 (H) 01/13/2018   Antithyroid antibodies: 01/13/2018: TR Ab 34.78 (0-1.75) No results found for: TSI   Thyroid uptake and scan (02/20/2018): Elevated uptake, at 56%, homogeneous scan, consistent with Graves' disease.  She was initially started on metoprolol but she stopped and she mentioned that she felt better off the medication.  Pt denies: - feeling nodules in neck - hoarseness - dysphagia - choking - SOB with lying down  However, she complains of: - Headaches - eye pain - Weight loss of at least 20 pounds in 4 months  She denies: - fatigue - excessive sweating/heat intolerance - tremors - anxiety - palpitations - hyperdefecation - hair loss  Pt does not have a FH of thyroid ds. No FH of thyroid cancer. No h/o radiation tx to head or neck.  No seaweed or kelp, no recent contrast studies. No steroid use. No herbal supplements. No Biotin use.  Pt. also has a history of ***.  ROS: Constitutional: + See HPI Eyes: no blurry vision, no xerophthalmia ENT: no sore throat, + see HPI Cardiovascular: no CP/SOB/palpitations/leg swelling Respiratory: no cough/SOB Gastrointestinal: no N/V/D/C Musculoskeletal: no muscle/joint aches Skin: no rashes Neurological: no tremors/numbness/tingling/dizziness Psychiatric: no depression/anxiety  PE: There were no vitals  taken for this visit. Wt Readings from Last 3 Encounters:  04/11/18 145 lb 6.4 oz (66 kg)  02/10/18 157 lb (71.2 kg)  01/31/18 164 lb 3.2 oz (74.5 kg)   Constitutional: Normal weight, in NAD Eyes: PERRLA, EOMI, no exophthalmos, no lid lag, no stare ENT: moist mucous membranes, no thyromegaly, no thyroid bruits, no cervical lymphadenopathy Cardiovascular: RRR, No MRG Respiratory: CTA B Gastrointestinal: abdomen soft, NT, ND, BS+ Musculoskeletal: no deformities, strength intact in all 4 Skin: moist, warm, no rashes Neurological: no tremor with outstretched hands, DTR normal in all 4  ASSESSMENT: 1.  Graves' disease  PLAN:  1. Patient with relatively recent diagnosis of thyrotoxicosis confirmed as Graves' disease based on elevated TRAbs and also her thyroid uptake and scan.  She is not on thionamides.  She was started on metoprolol but she could not tolerate this well and she is now off the medication. -She complains of thyrotoxic symptoms: Weight loss, *** -We discussed about her recent diagnosis of Graves' disease, the fact that this is an autoimmune disease in which her body produces antibodies against her own thyroid.  These are stimulating for the thyroid gland with subsequent overproduction of thyroid hormones. -we discussed about possible modalities of treatment for the above conditions, to include methimazole use, radioactive iodine ablation or (last resort) surgery. -No need to perform thyroid ultrasound since there are no cold nodules on her thyroid uptake and scan - will check the TSH, fT3 and fT4 and also add TSI antibodies.  - If the tests remain abnormal, we decided to start methimazole.  We discussed about benefits and possible side effects of the  medication.  - I do not feel that we need to add beta blockers at this time, since she is not tachycardic or tremulous - RTC in 3 months, but likely sooner for repeat labs  Cheryl Kingdom, MD PhD [facility] Endocrinology

## 2018-09-04 IMAGING — US US PELVIS COMPLETE
1 series · 14 of 25 positions shown · non-contrast
Comparison: None

CLINICAL DATA: Initial evaluation for acute left lower quadrant
pain, intermittent vaginal discharge.



[Series 1: us pelvis complete · 0.24mm/px · 161 acquisitions, 14 frames shown]
[im 1/161]
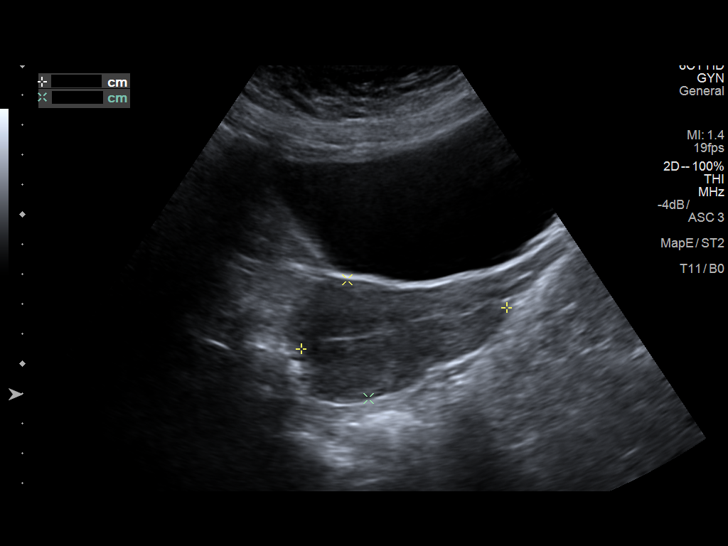
[im 14/161]
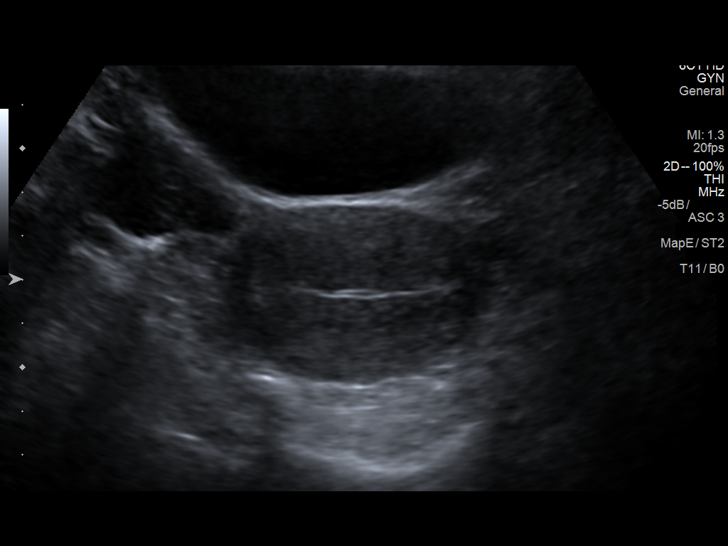
[im 27/161]
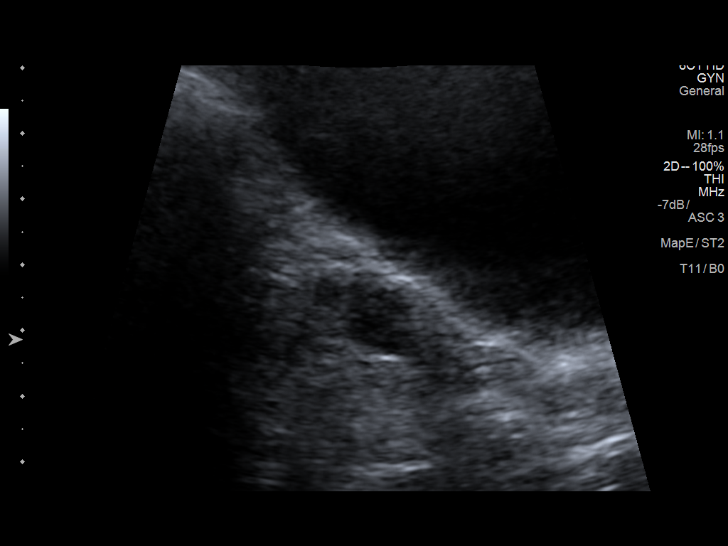
[im 41/161]
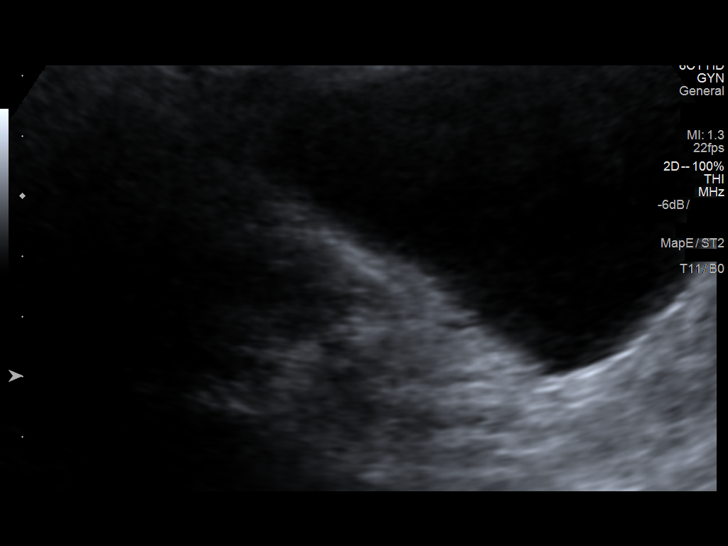
[im 54/161]
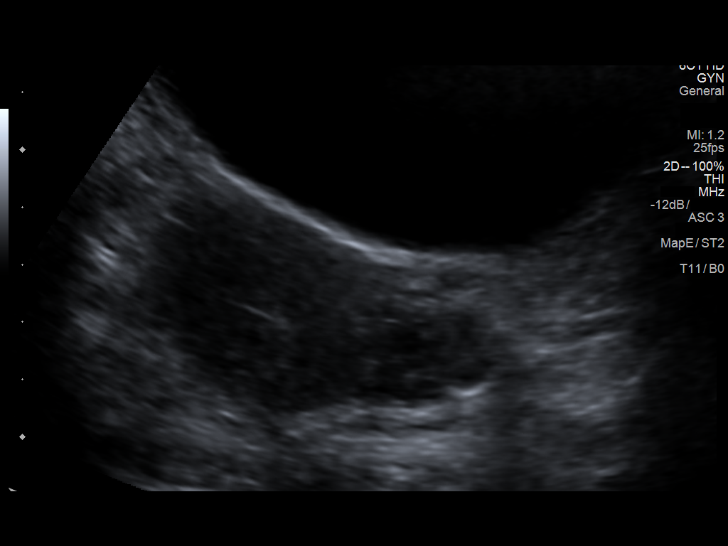
[im 61/161]
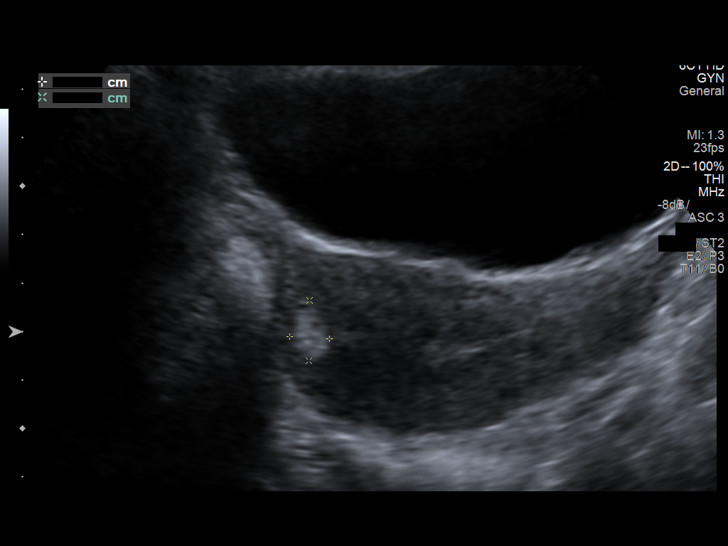
[im 74/161]
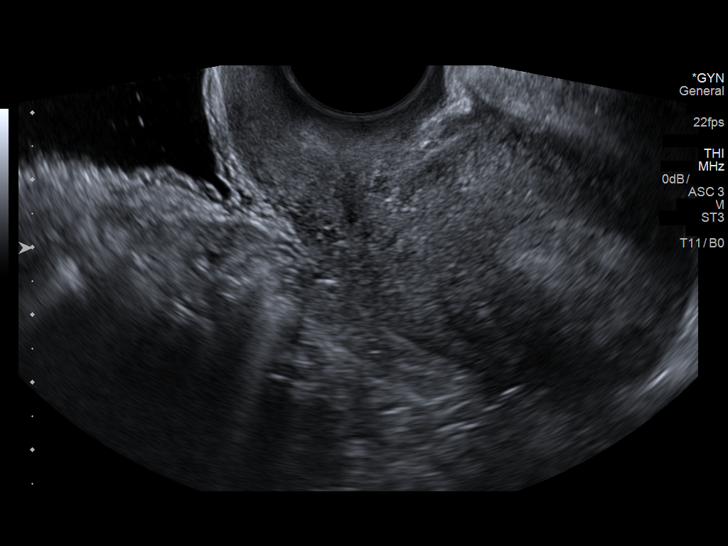
[im 87/161]
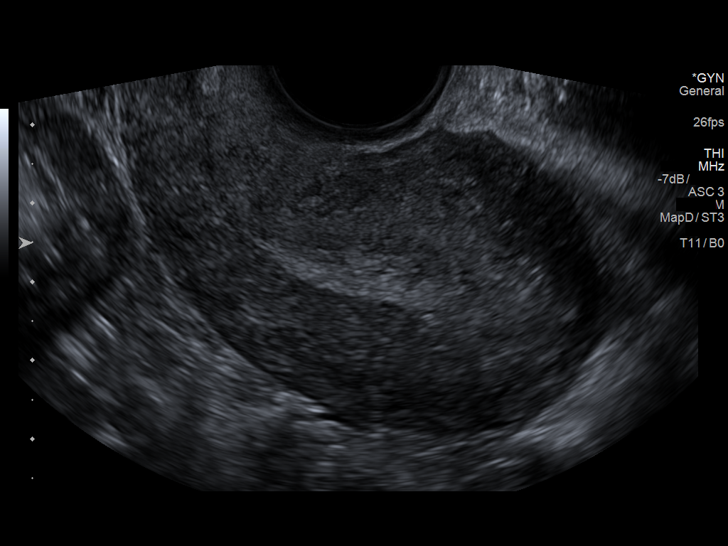
[im 101/161]
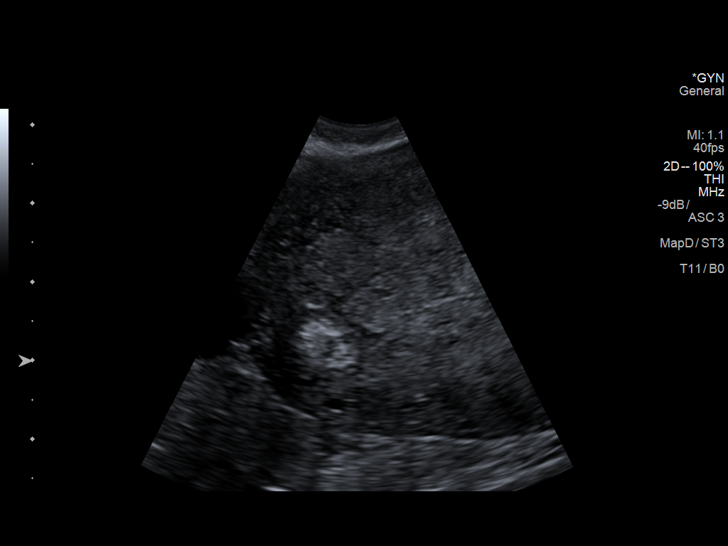
[im 107/161]
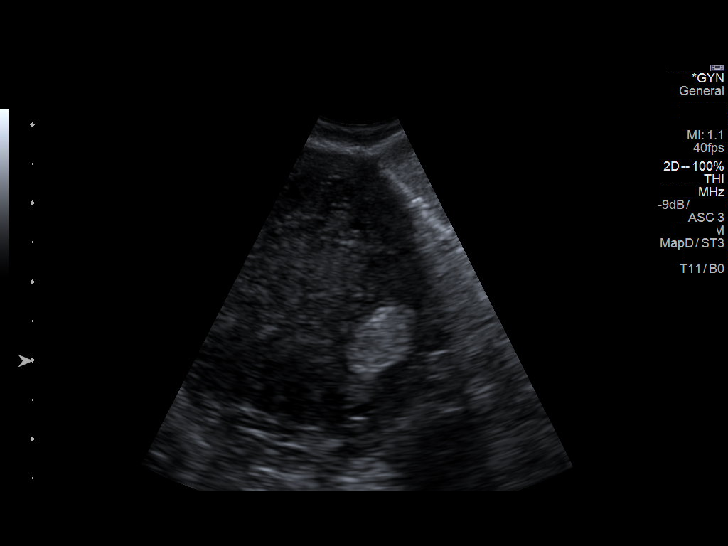
[im 121/161]
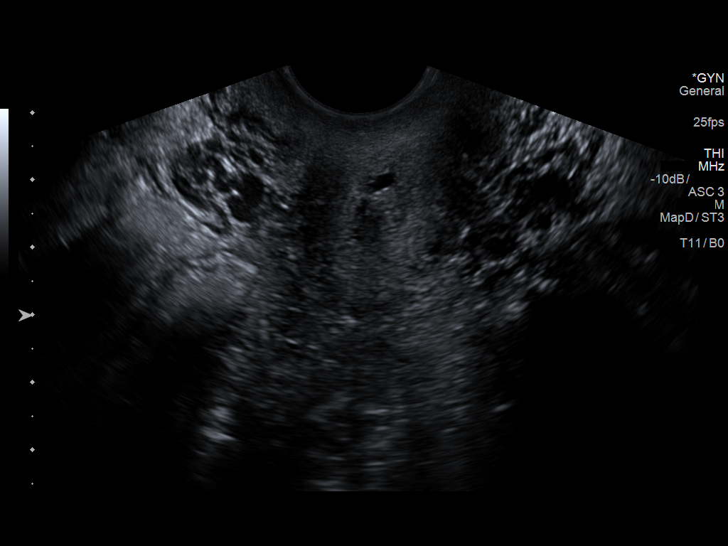
[im 134/161]
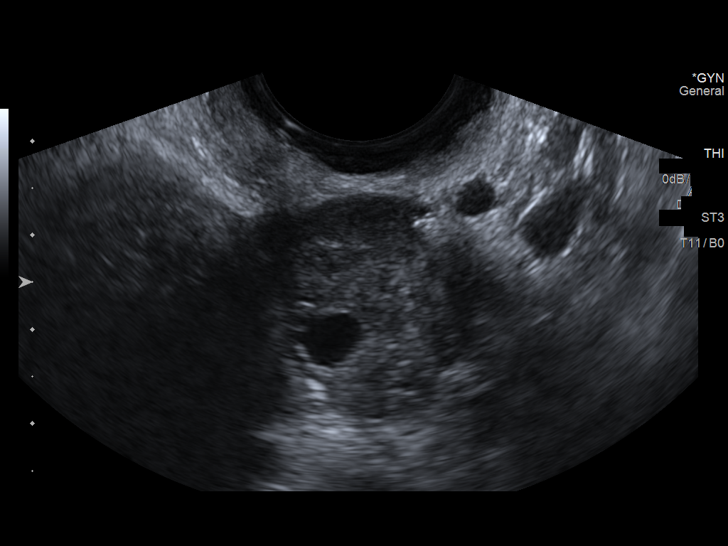
[im 147/161]
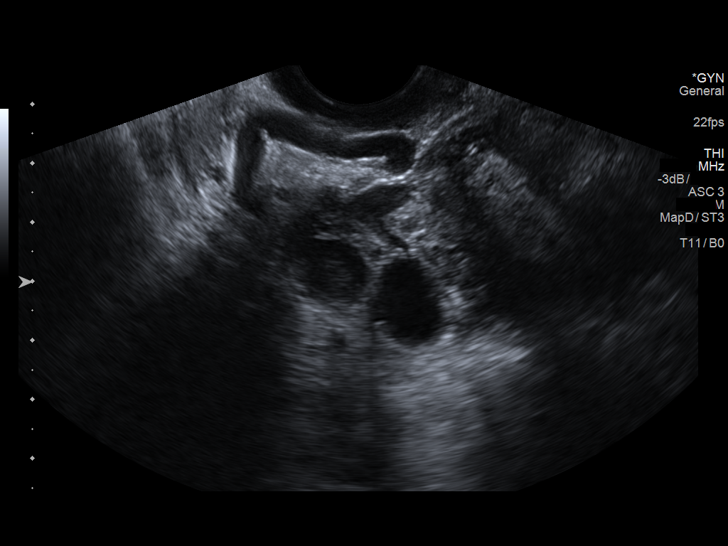
[im 161/161]
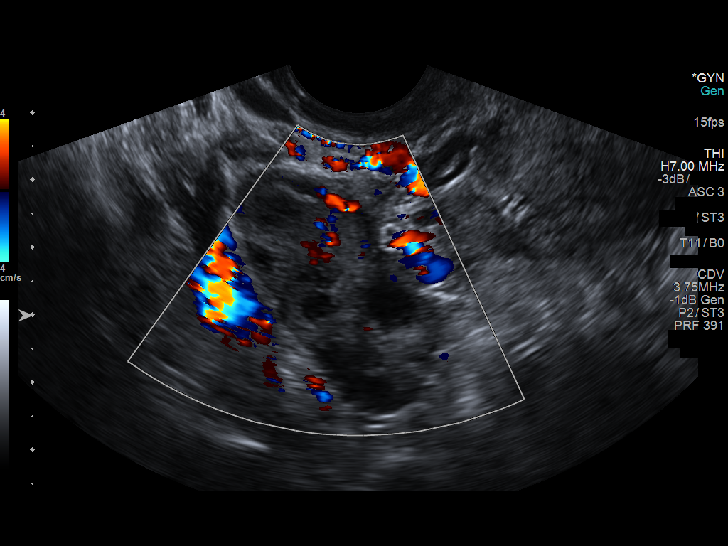

[14 of 25 positions shown; findings below may reference images not displayed]

FINDINGS: Uterus

Measurements: 7.8 x 4.2 x 6.4 cm. 0.9 x 1.1 x 0.9 cm
well-circumscribed hyperechoic lesion at the right uterine fundus,
likely a small fibroid.

Endometrium

Thickness: 11.3 mm.  No focal abnormality visualized.

Right ovary

Measurements: 2.2 x 3.4 x 3.4 cm. Normal appearance/no adnexal mass.

Left ovary

Measurements: 2.8 x 2.6 x 1.8 cm. Normal appearance/no adnexal mass.

Other findings

Trace free fluid within the pelvis.
IMPRESSION: 1. 1.1 cm uterine fibroid.
2. Otherwise unremarkable pelvic ultrasound.

## 2018-09-24 IMAGING — US US THYROID
1 series · 14 of 25 positions shown · non-contrast
Comparison: None.

CLINICAL DATA: Hyperthyroid.  Thyromegaly.

EXAM:
THYROID ULTRASOUND
TECHNIQUE: Ultrasound examination of the thyroid gland and adjacent soft
tissues was performed.

[Series 1: us thyroid · 0.07mm/px · 14 of 53 slices shown]
[im 1/53]
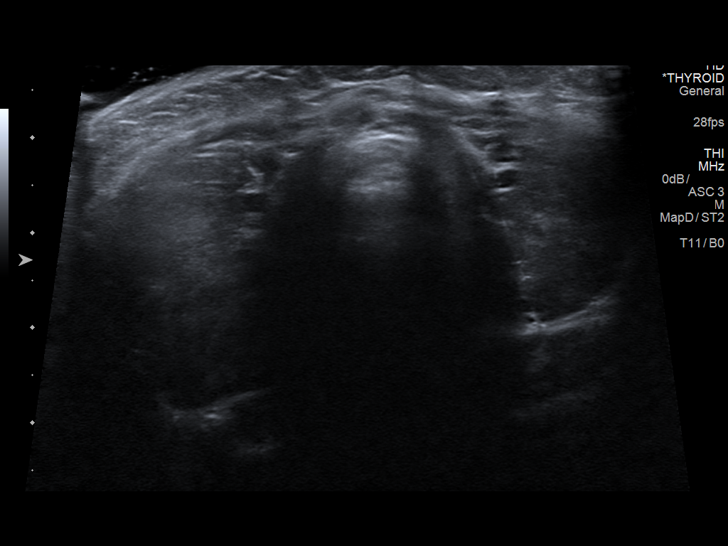
[im 5/53]
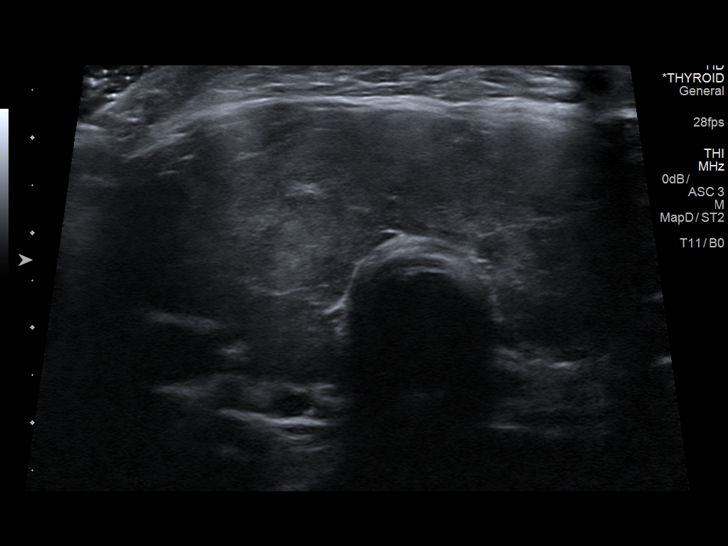
[im 9/53]
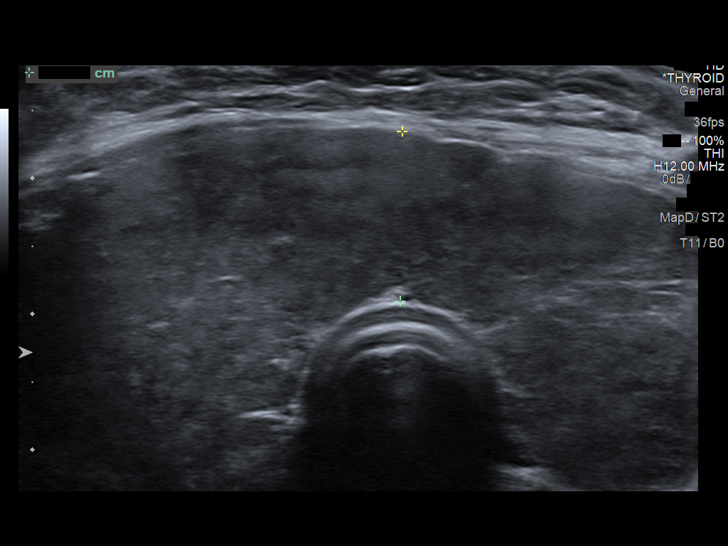
[im 14/53]
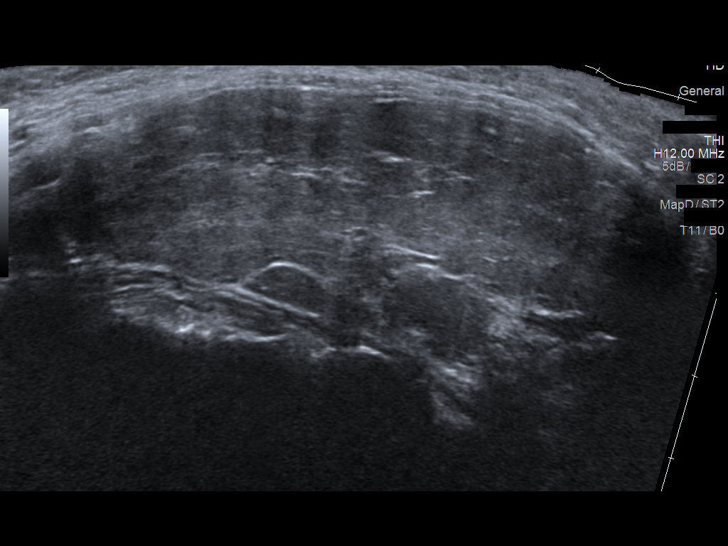
[im 18/53]
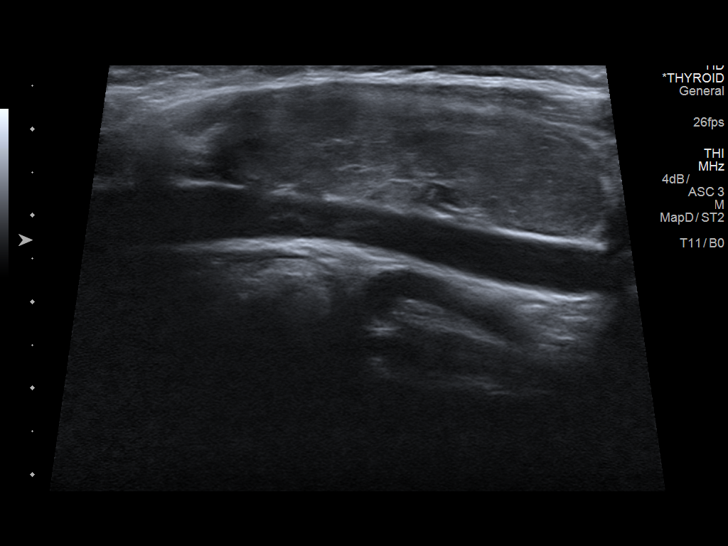
[im 20/53]
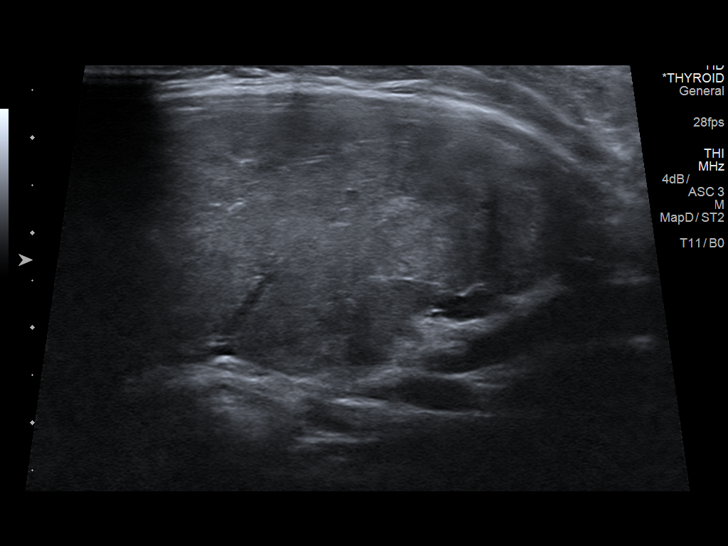
[im 24/53]
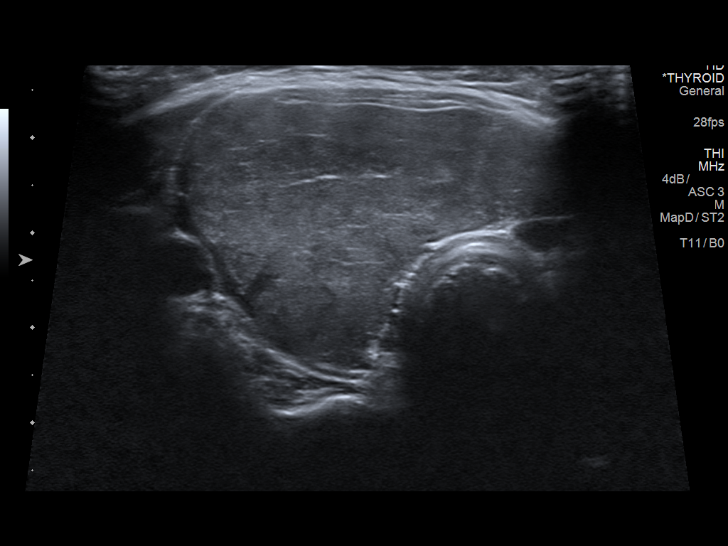
[im 29/53]
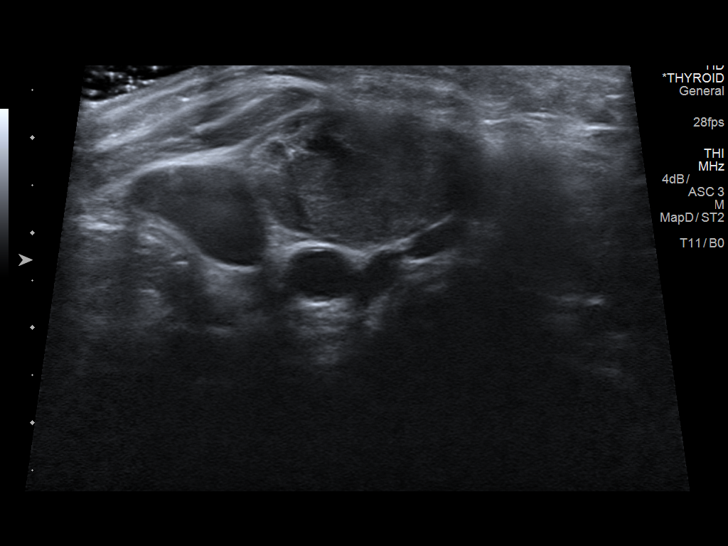
[im 33/53]
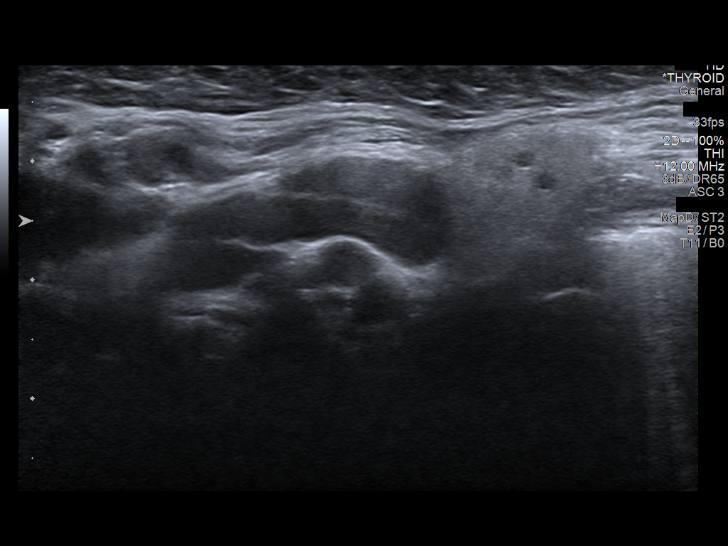
[im 35/53]
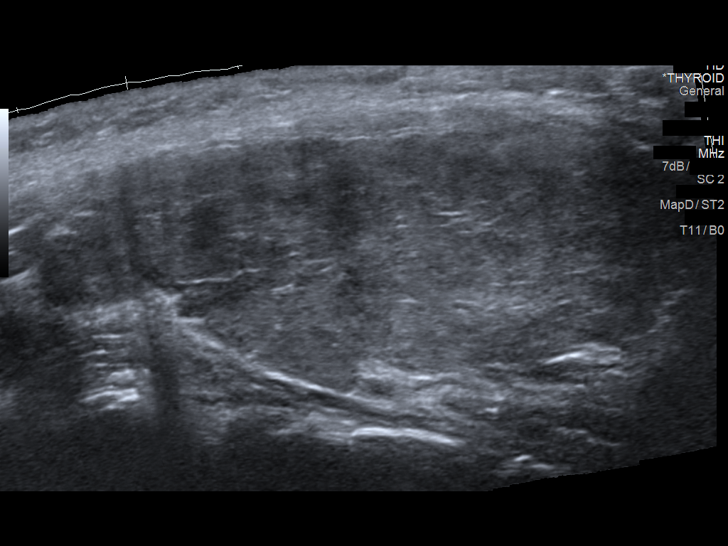
[im 40/53]
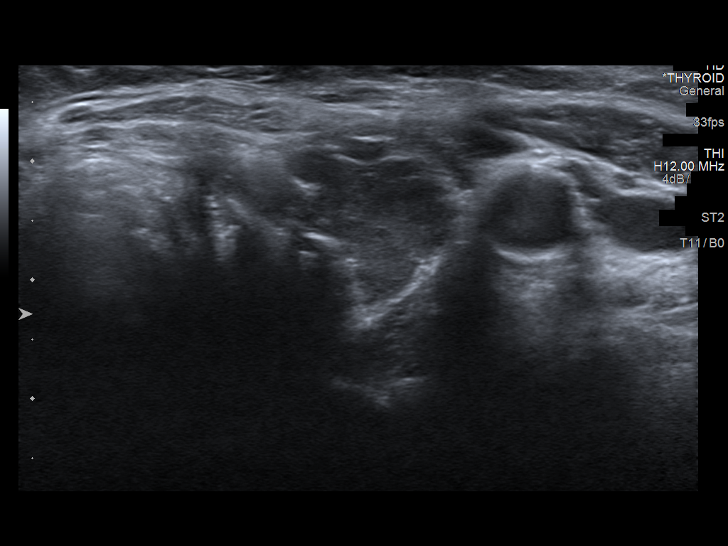
[im 44/53]
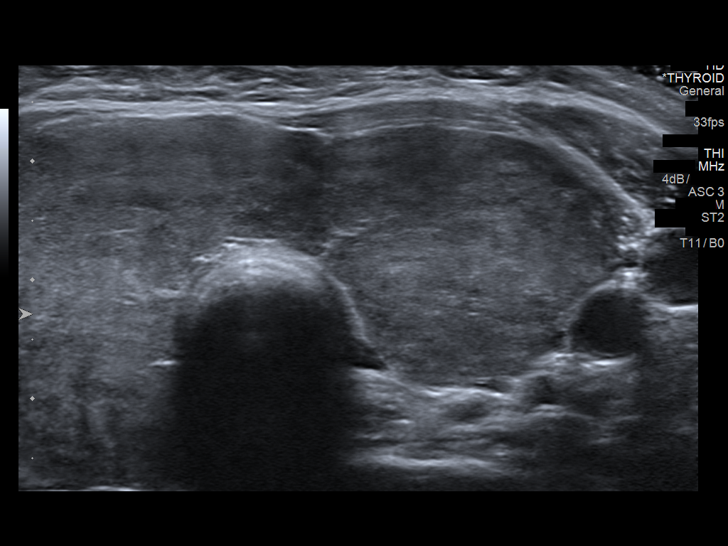
[im 48/53]
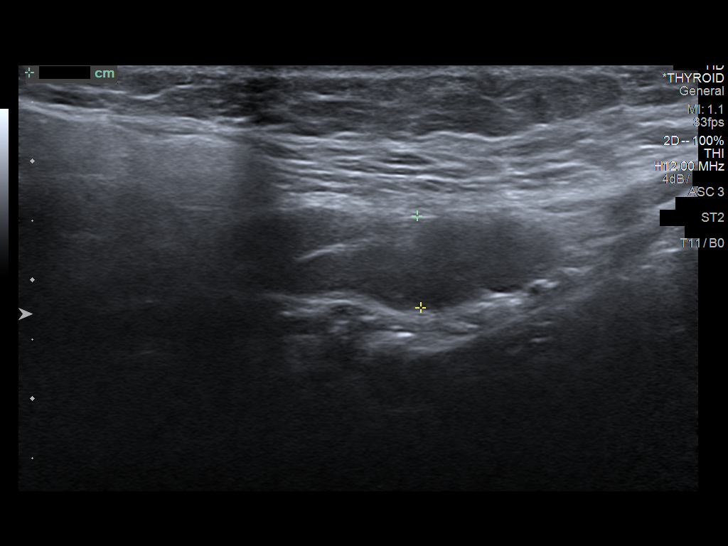
[im 53/53]
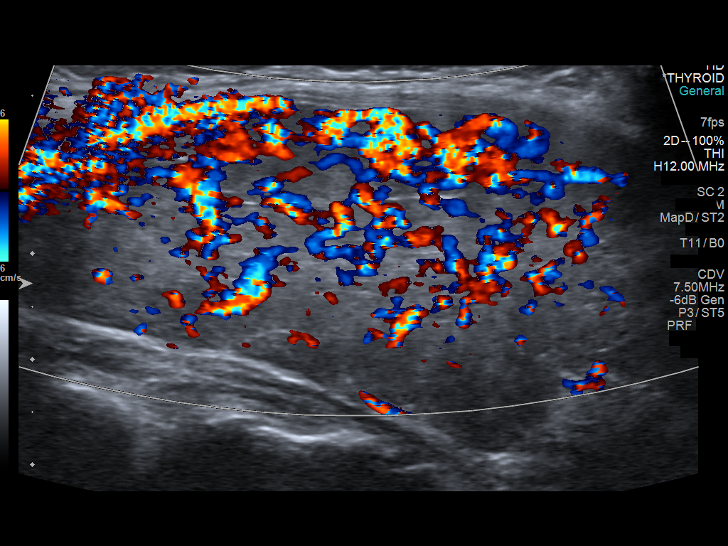

[14 of 25 positions shown; findings below may reference images not displayed]

FINDINGS: Parenchymal Echotexture: Moderately heterogenous

Isthmus: 1.3 cm

Right lobe: 7.1 x 2.9 x 2.8 cm

Left lobe: 5.8 x 2.2 x 2.6 cm

_________________________________________________________

Estimated total number of nodules >/= 1 cm: 0

Number of spongiform nodules >/=  2 cm not described below (TR1): 0

Number of mixed cystic and solid nodules >/= 1.5 cm not described
below (TR2): 0

_________________________________________________________

The gland is heterogeneous and hypervascular but without discrete
focal nodule.
IMPRESSION: The gland is enlarged and hypervascular but without discrete focal
nodule. Consider subacute thyroiditis.

The above is in keeping with the ACR TI-RADS recommendations - [HOSPITAL] 7170;[DATE].

## 2018-12-10 ENCOUNTER — Encounter: Payer: Self-pay | Admitting: Obstetrics & Gynecology

## 2018-12-16 ENCOUNTER — Encounter: Payer: Self-pay | Admitting: Family Medicine

## 2019-02-09 ENCOUNTER — Encounter: Payer: Self-pay | Admitting: Family Medicine

## 2019-03-09 ENCOUNTER — Encounter: Payer: Self-pay | Admitting: "Endocrinology

## 2019-03-09 ENCOUNTER — Other Ambulatory Visit: Payer: Self-pay

## 2019-03-09 ENCOUNTER — Ambulatory Visit (INDEPENDENT_AMBULATORY_CARE_PROVIDER_SITE_OTHER): Payer: BC Managed Care – PPO | Admitting: "Endocrinology

## 2019-03-09 VITALS — BP 146/84 | HR 125 | Temp 98.3°F | Ht 65.0 in | Wt 132.8 lb

## 2019-03-09 DIAGNOSIS — E0590 Thyrotoxicosis, unspecified without thyrotoxic crisis or storm: Secondary | ICD-10-CM | POA: Diagnosis not present

## 2019-03-09 MED ORDER — PROPYLTHIOURACIL 50 MG PO TABS: 50.0000 mg | ORAL_TABLET | Freq: Three times a day (TID) | ORAL | 3 refills | Status: DC

## 2019-03-09 MED ORDER — PREDNISONE 20 MG PO TABS: 20.0000 mg | ORAL_TABLET | Freq: Every day | ORAL | 2 refills | Status: DC

## 2019-03-09 MED ORDER — PROPRANOLOL HCL 40 MG PO TABS: 40.0000 mg | ORAL_TABLET | Freq: Three times a day (TID) | ORAL | 0 refills | Status: DC

## 2019-03-09 MED ORDER — PROPRANOLOL HCL 40 MG PO TABS: 40.0000 mg | ORAL_TABLET | Freq: Three times a day (TID) | ORAL | 3 refills | Status: DC
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
## 2019-03-10 ENCOUNTER — Telehealth: Payer: Self-pay

## 2019-03-10 NOTE — Telephone Encounter (Signed)
Spoke to [place] she states that she never went to the hospital she states she has stopped vomiting at the moment, I advised her to go to the hospital if symptoms reaccurres

## 2019-03-10 NOTE — Telephone Encounter (Signed)
I called and left a voicemail for patient to call us back with an update on how she was feeling today

## 2019-03-10 NOTE — Telephone Encounter (Signed)
I spoke with her yesterday and instructed her to go to emergency room for impending thyroid storm.  She preferred [facility] in [place].I do not see any notes yet about that visit.   Check again on the phone in 2  hours , we need to get some information on her today.

## 2019-03-12 ENCOUNTER — Telehealth: Payer: Self-pay

## 2019-03-13 ENCOUNTER — Telehealth: Payer: Self-pay

## 2019-03-13 NOTE — Telephone Encounter (Signed)
Attempted to contact pt for wellness check due to possible thyroid storm. No answer.

## 2019-03-13 NOTE — Telephone Encounter (Signed)
Attempted to contact pt again today for a wellness check due to possible thyroid storm. No answer. Pts voice mail is full.

## 2019-03-17 ENCOUNTER — Ambulatory Visit: Payer: BC Managed Care – PPO | Admitting: "Endocrinology

## 2019-03-17 ENCOUNTER — Telehealth: Payer: Self-pay | Admitting: "Endocrinology

## 2019-03-17 NOTE — Telephone Encounter (Signed)
Even before we decide on her definitive treatment, she has to control the thyroid by taking the 3 medications I prescribed. Is she taking them? If so, we need her to do labs to see the progress.

## 2019-03-17 NOTE — Telephone Encounter (Signed)
Dr Dorris Fetch, She called me back and said she does not want to do Radio Active treatment and rather have surgery. She can not do labs right now due to transportation. I told her I would speak with you and see what you say and call her back

## 2019-03-18 NOTE — Telephone Encounter (Signed)
Left patient VM and advised of message, asked her to call me back.

## 2019-03-19 ENCOUNTER — Other Ambulatory Visit: Payer: Self-pay

## 2019-03-19 MED ORDER — PROPYLTHIOURACIL 50 MG PO TABS: 50.0000 mg | ORAL_TABLET | Freq: Three times a day (TID) | ORAL | 3 refills | Status: DC

## 2019-03-19 NOTE — Telephone Encounter (Signed)
No answer

## 2019-03-19 NOTE — Telephone Encounter (Signed)
I called & spoke with Cheryl Bowman this morning & she said she is taking the medications that you prescribed but has no transportation to go have labs drawn

## 2019-03-19 NOTE — Telephone Encounter (Signed)
  Cheryl Bowman, since August 3 , we have been trying to reach out to this patient to make sure that she feels better 3 prescriptions to treat severe hyperthyroidism as an outpatient due to her concerns to go to the hospital related to current COVID-19 .  Patient failed to answer her phone which is not receiving any voicemail and did not call back.  She did not disclose the fact that she did not fill the PTU which is the main antithyroid therapy, until this morning. Patient did have nausea and occasional vomiting even prior to her last visit, which may be a sign of impending thyroid storm.  -She said she could not do our labs due to problems with transportation and even declined future plan of I-131 thyroid ablation. This morning, we strongly made the case for her that she is increasingly at risk for thyroid storm and its related mortality risk.  At this point, she is too high risk to attempt to treat as an outpatient and advised to go to emergency room and she accepts.  she declined offer to send ambulance services for her.  Plan to attempt to call her later today to know her decisions.

## 2019-03-19 NOTE — Telephone Encounter (Signed)
Pt was notified to go to the ER by myself and Dr Dorris Fetch after finding out that pt did not get the PTU. PTU was sent to pharmacy. Pt verbally agrees to go to the ER today.

## 2019-03-24 ENCOUNTER — Encounter: Payer: Self-pay | Admitting: "Endocrinology

## 2019-04-06 ENCOUNTER — Other Ambulatory Visit: Payer: Self-pay

## 2019-04-06 MED ORDER — METHIMAZOLE 5 MG PO TABS: 5.0000 mg | ORAL_TABLET | Freq: Two times a day (BID) | ORAL | 0 refills | Status: DC

## 2019-04-16 ENCOUNTER — Telehealth: Payer: Self-pay | Admitting: "Endocrinology

## 2019-04-16 NOTE — Telephone Encounter (Signed)
Pt was referred to another endicrinologist by her PCP. This was per pt request. She told [facility] she no longer wanted to see Dr Dorris Fetch. After several attempts in the past to have pt go to the ER due to Thyroid storm. She refused and this is when she requested to see another endocrinologist. We notified pt that we would give her 30 days of medication. Anne at [facility] she was referring her to an Musician.  Energy manager at [employer] that we can not take her on again as a pt. Pt was notified of this as well. See other messages.

## 2019-11-12 IMAGING — DX DG SCOLIOSIS EVAL COMPLETE SPINE 1V
1 series · 4 of 4 positions shown · non-contrast
Comparison: No recent.

CLINICAL DATA: Scoliosis.

EXAM:
DG SCOLIOSIS EVAL COMPLETE SPINE 1V

[Series 1: whole body ap · 0.14mm/px · 4 of 4 slices shown]
[im 1/4]
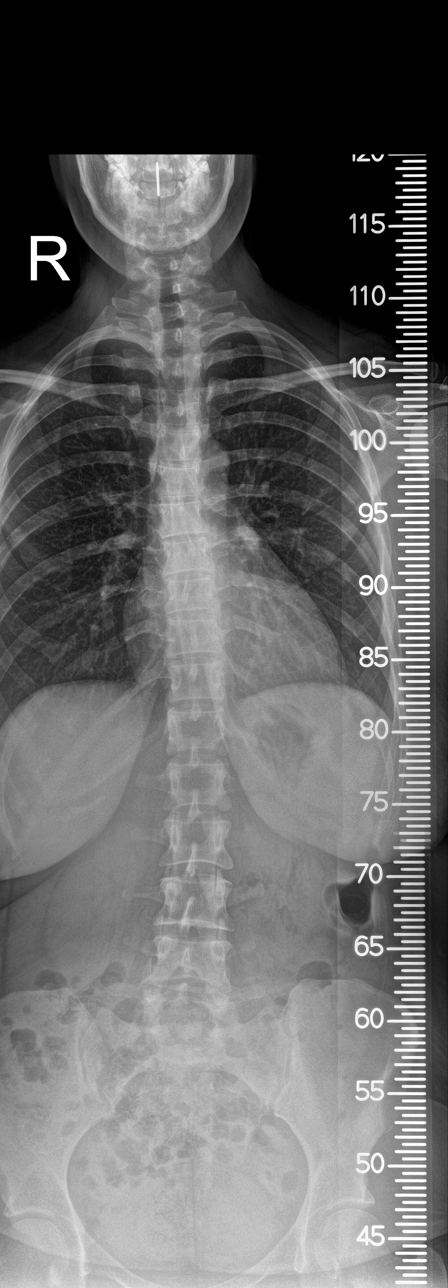
[im 2/4]
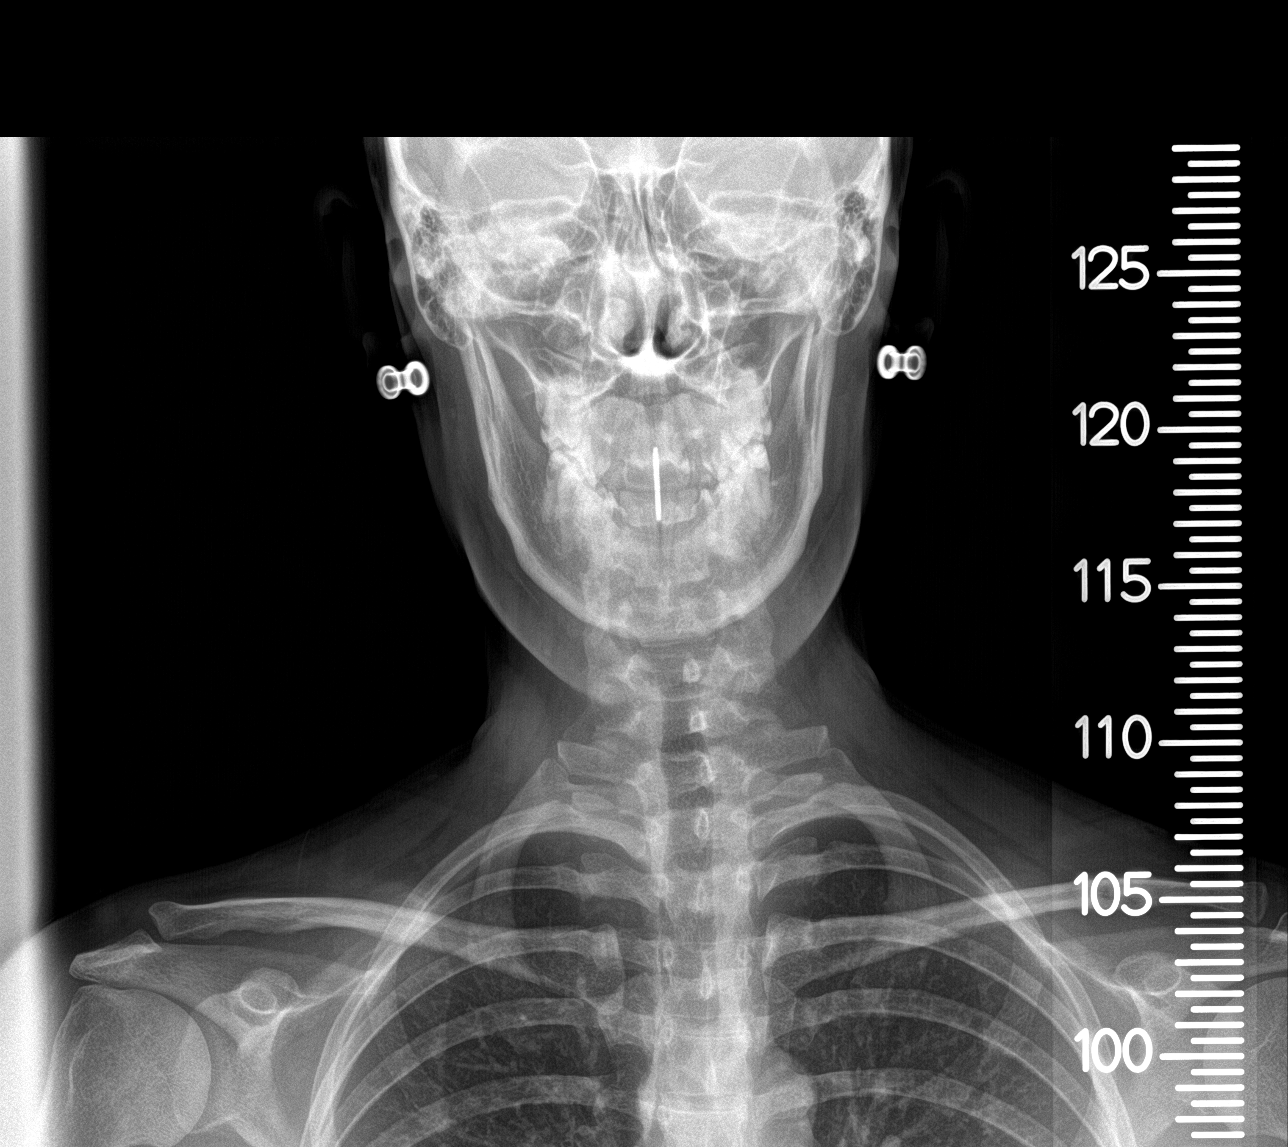
[im 3/4]
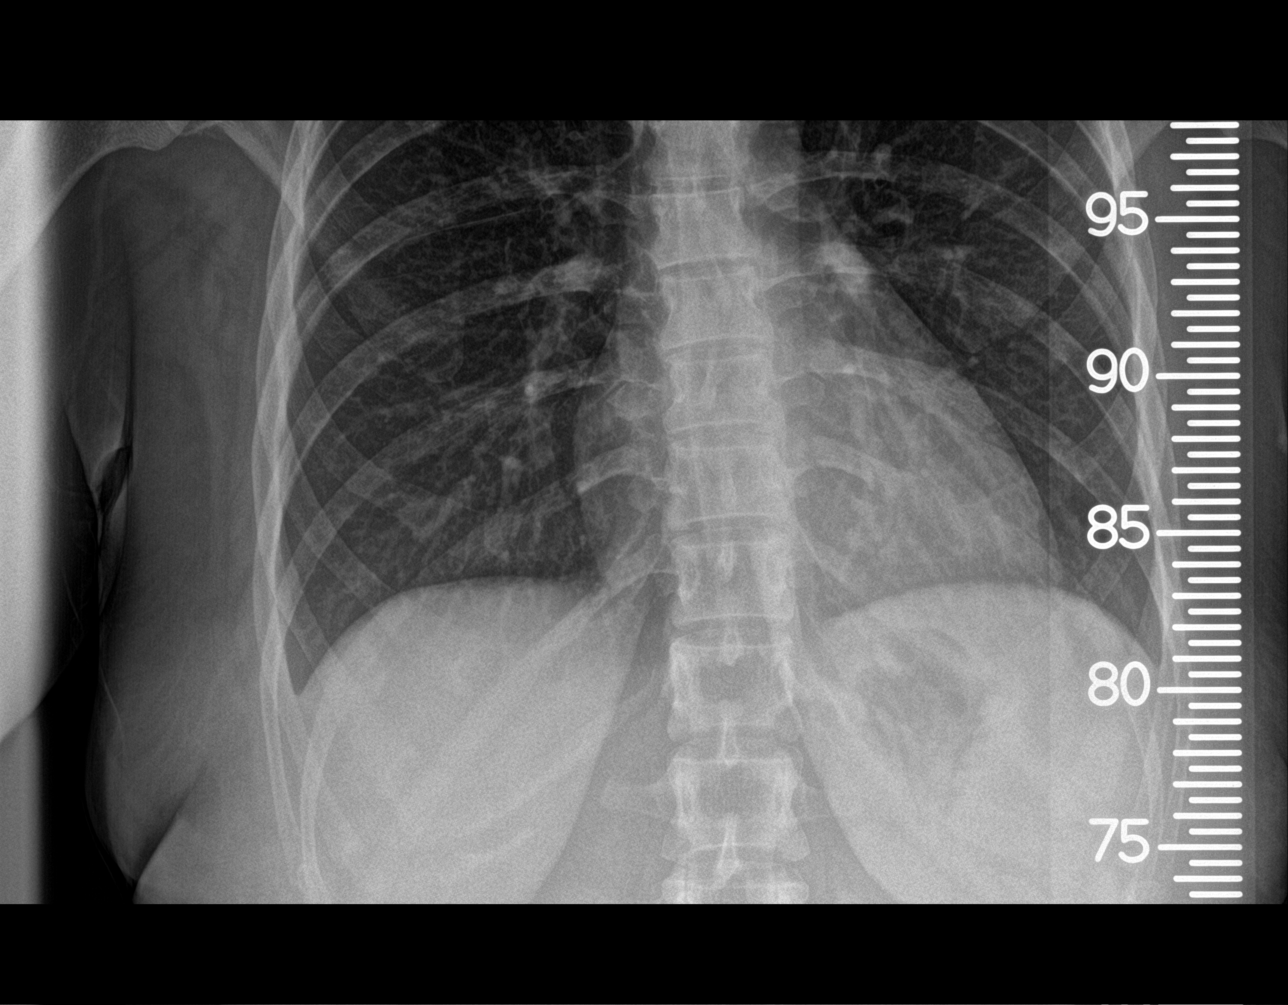
[im 4/4]
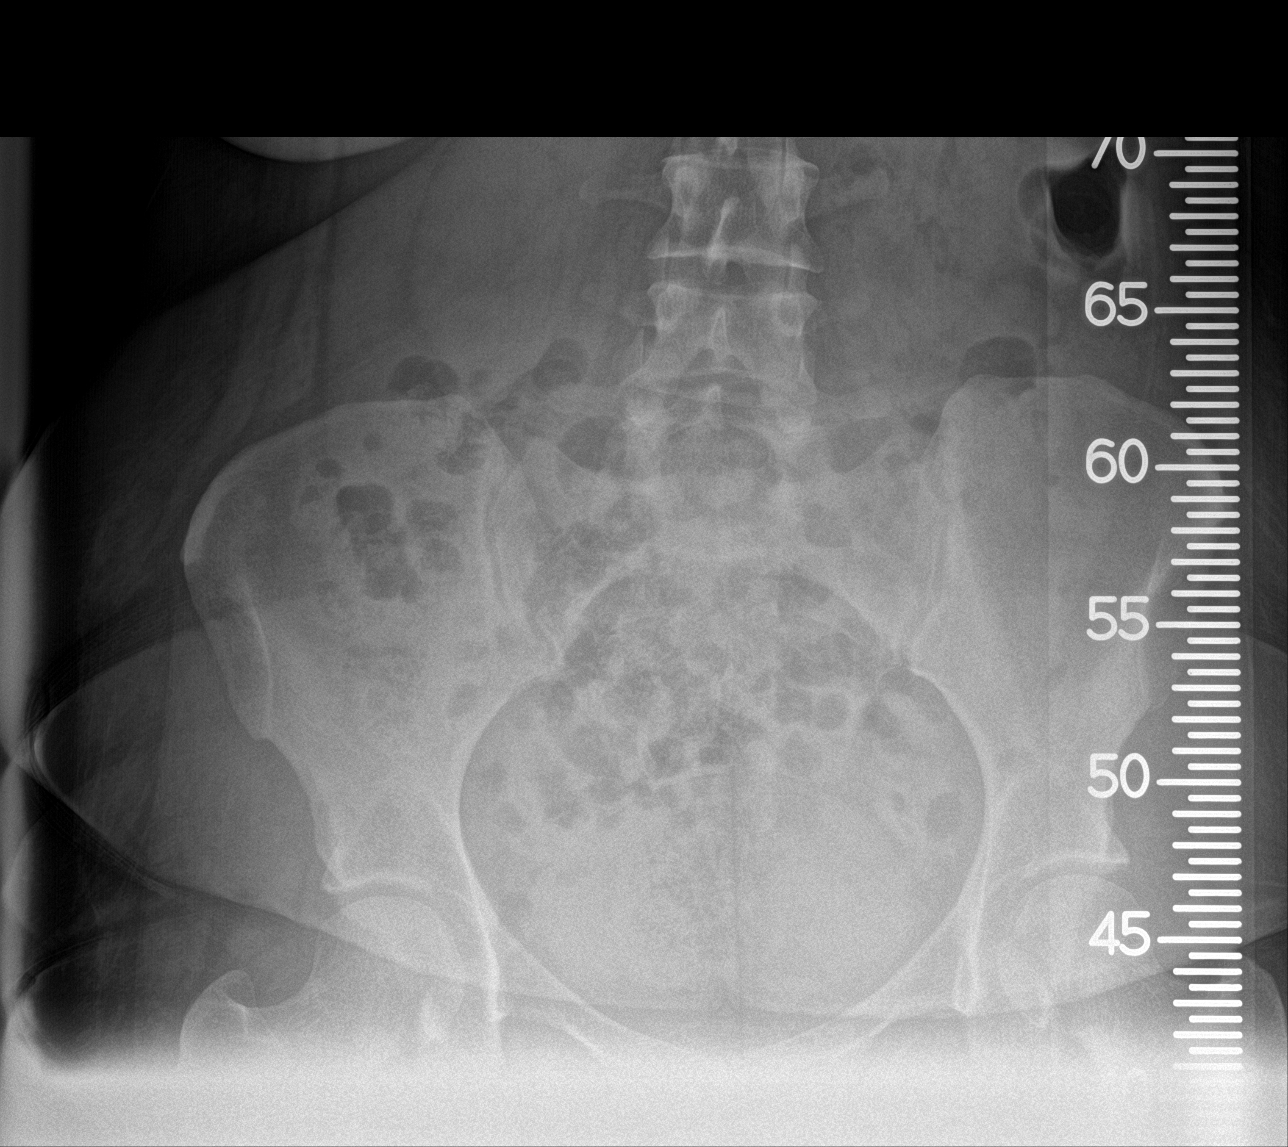

[4 of 4 positions shown; findings below may reference images not displayed]

FINDINGS: Upper thoracic scoliosis concave right 9 degrees. Midthoracic
scoliosis concave left 6 degrees. Mid lumbar scoliosis concave right
10 degrees. No acute bony abnormality.
IMPRESSION: Thoracolumbar spine scoliosis as above.

## 2021-02-01 ENCOUNTER — Other Ambulatory Visit: Payer: Self-pay

## 2021-02-01 ENCOUNTER — Ambulatory Visit (INDEPENDENT_AMBULATORY_CARE_PROVIDER_SITE_OTHER): Payer: 59 | Admitting: Endocrinology

## 2021-02-01 ENCOUNTER — Encounter: Payer: Self-pay | Admitting: Endocrinology

## 2021-02-01 VITALS — BP 140/72 | HR 81 | Ht 65.0 in | Wt 140.6 lb

## 2021-02-01 DIAGNOSIS — E0590 Thyrotoxicosis, unspecified without thyrotoxic crisis or storm: Secondary | ICD-10-CM | POA: Diagnosis not present

## 2021-02-01 MED ORDER — METHIMAZOLE 10 MG PO TABS: 10.0000 mg | ORAL_TABLET | Freq: Two times a day (BID) | ORAL | 2 refills | Status: DC

## 2021-02-01 MED ORDER — METOPROLOL SUCCINATE ER 25 MG PO TB24: 25.0000 mg | ORAL_TABLET | Freq: Every day | ORAL | 3 refills | Status: DC
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
## 2021-02-01 NOTE — Patient Instructions (Addendum)
I have sent 2 prescriptions to your pharmacy: to slow the thyroid, and to slow the heart racing, while the first pill is working. If ever you have fever while taking methimazole, stop it and call us, even if the reason is obvious, because of the risk of a rare side-effect.  Please come back for a follow-up appointment 02/13/21, 4:30PM

## 2021-02-13 ENCOUNTER — Other Ambulatory Visit: Payer: Self-pay

## 2021-02-13 ENCOUNTER — Ambulatory Visit (INDEPENDENT_AMBULATORY_CARE_PROVIDER_SITE_OTHER): Payer: 59 | Admitting: Endocrinology

## 2021-02-13 VITALS — BP 170/120 | HR 96 | Ht 65.0 in | Wt 142.2 lb

## 2021-02-13 DIAGNOSIS — E0590 Thyrotoxicosis, unspecified without thyrotoxic crisis or storm: Secondary | ICD-10-CM

## 2021-02-13 MED ORDER — METOPROLOL SUCCINATE ER 50 MG PO TB24: 50.0000 mg | ORAL_TABLET | Freq: Every day | ORAL | 3 refills | Status: AC
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
## 2021-02-13 NOTE — Patient Instructions (Addendum)
Blood tests are requested for you today.  We'll let you know about the results.   If ever you have fever while taking methimazole, stop it and call us, even if the reason is obvious, because of the risk of a rare side-effect.  I have sent a prescription to your pharmacy, to double the metoprolol.   Please come back for a follow-up appointment in 6 weeks, at 4:30 PM.

## 2021-02-14 LAB — TSH: TSH: <0.01 u[IU]/mL — ABNORMAL LOW (ref 0.35–5.50)

## 2021-02-14 LAB — T4, FREE: Free T4: 1.41 ng/dL (ref 0.60–1.60)

## 2021-03-02 ENCOUNTER — Encounter: Payer: Self-pay | Admitting: Endocrinology

## 2021-03-07 NOTE — Telephone Encounter (Signed)
Noted  

## 2021-03-27 ENCOUNTER — Other Ambulatory Visit: Payer: Self-pay

## 2021-03-27 ENCOUNTER — Ambulatory Visit (INDEPENDENT_AMBULATORY_CARE_PROVIDER_SITE_OTHER): Payer: 59 | Admitting: Endocrinology

## 2021-03-27 ENCOUNTER — Encounter: Payer: Self-pay | Admitting: Endocrinology

## 2021-03-27 VITALS — BP 172/110 | HR 106 | Ht 65.0 in | Wt 156.0 lb

## 2021-03-27 DIAGNOSIS — E0590 Thyrotoxicosis, unspecified without thyrotoxic crisis or storm: Secondary | ICD-10-CM

## 2021-03-27 LAB — TSH: TSH: 0.33 u[IU]/mL — ABNORMAL LOW (ref 0.35–5.50)

## 2021-03-27 LAB — T4, FREE: Free T4: 0.05 ng/dL — ABNORMAL LOW (ref 0.60–1.60)
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
## 2021-03-27 NOTE — Patient Instructions (Addendum)
Blood tests are requested for you today.  We'll let you know about the results.   If ever you have fever while taking methimazole, stop it and call us, even if the reason is obvious, because of the risk of a rare side-effect.  It is best to never miss the medication.  However, if you do miss it, next best is to double up the next time. Your blood pressure is high today.  Please see your primary care provider soon, to have it rechecked.   Please come back for a follow-up appointment in 6 weeks.

## 2021-03-28 MED ORDER — METHIMAZOLE 10 MG PO TABS: 10.0000 mg | ORAL_TABLET | Freq: Every day | ORAL | 2 refills | Status: DC

## 2021-04-21 ENCOUNTER — Encounter: Payer: Self-pay | Admitting: Endocrinology

## 2021-04-27 ENCOUNTER — Ambulatory Visit (INDEPENDENT_AMBULATORY_CARE_PROVIDER_SITE_OTHER): Payer: 59 | Admitting: Endocrinology

## 2021-04-27 ENCOUNTER — Other Ambulatory Visit: Payer: Self-pay

## 2021-04-27 ENCOUNTER — Other Ambulatory Visit (INDEPENDENT_AMBULATORY_CARE_PROVIDER_SITE_OTHER): Payer: 59

## 2021-04-27 ENCOUNTER — Other Ambulatory Visit: Payer: 59

## 2021-04-27 VITALS — BP 180/100 | HR 94 | Ht 65.0 in | Wt 155.0 lb

## 2021-04-27 DIAGNOSIS — E0590 Thyrotoxicosis, unspecified without thyrotoxic crisis or storm: Secondary | ICD-10-CM | POA: Diagnosis not present

## 2021-04-27 LAB — TSH: TSH: 51.25 u[IU]/mL — ABNORMAL HIGH (ref 0.35–5.50)

## 2021-04-27 LAB — T4, FREE: Free T4: 0 ng/dL — ABNORMAL LOW (ref 0.60–1.60)
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
## 2021-04-27 NOTE — Patient Instructions (Addendum)
Blood tests are requested for you today.  We'll let you know about the results.   If ever you have fever while taking methimazole, stop it and call us, even if the reason is obvious, because of the risk of a rare side-effect.  It is best to never miss the medication.  However, if you do miss it, next best is to double up the next time. Your blood pressure is high today.  Please see your primary care provider soon, to have it rechecked.   Let's recheck the ultrasound.  you will receive a phone call, about a day and time for an appointment. Please let me know if you want to see a surgery specialist.   Please come back for a follow-up appointment in 2-3 months.

## 2021-05-08 ENCOUNTER — Other Ambulatory Visit (INDEPENDENT_AMBULATORY_CARE_PROVIDER_SITE_OTHER): Payer: 59

## 2021-05-08 ENCOUNTER — Other Ambulatory Visit: Payer: Self-pay | Admitting: Endocrinology

## 2021-05-08 DIAGNOSIS — E0590 Thyrotoxicosis, unspecified without thyrotoxic crisis or storm: Secondary | ICD-10-CM | POA: Diagnosis not present

## 2021-05-08 LAB — T4, FREE: Free T4: 0 ng/dL — ABNORMAL LOW (ref 0.60–1.60)

## 2021-05-08 LAB — TSH: TSH: 8.36 u[IU]/mL — ABNORMAL HIGH (ref 0.35–5.50)

## 2021-06-14 ENCOUNTER — Other Ambulatory Visit: Payer: Self-pay

## 2021-06-14 ENCOUNTER — Other Ambulatory Visit (INDEPENDENT_AMBULATORY_CARE_PROVIDER_SITE_OTHER): Payer: 59

## 2021-06-14 ENCOUNTER — Other Ambulatory Visit: Payer: Self-pay | Admitting: Endocrinology

## 2021-06-14 DIAGNOSIS — E0590 Thyrotoxicosis, unspecified without thyrotoxic crisis or storm: Secondary | ICD-10-CM | POA: Diagnosis not present

## 2021-06-14 LAB — TSH: TSH: 0.01 u[IU]/mL — ABNORMAL LOW (ref 0.35–5.50)

## 2021-06-14 LAB — T4, FREE: Free T4: 1.94 ng/dL — ABNORMAL HIGH (ref 0.60–1.60)

## 2021-06-14 MED ORDER — METHIMAZOLE 10 MG PO TABS: 10.0000 mg | ORAL_TABLET | Freq: Every day | ORAL | 0 refills | Status: AC

## 2021-07-06 ENCOUNTER — Ambulatory Visit (INDEPENDENT_AMBULATORY_CARE_PROVIDER_SITE_OTHER): Payer: 59 | Admitting: Endocrinology

## 2021-07-06 ENCOUNTER — Other Ambulatory Visit: Payer: Self-pay

## 2021-07-06 VITALS — BP 132/90 | HR 90 | Ht 65.0 in | Wt 160.0 lb

## 2021-07-06 DIAGNOSIS — E0590 Thyrotoxicosis, unspecified without thyrotoxic crisis or storm: Secondary | ICD-10-CM | POA: Diagnosis not present

## 2021-07-06 LAB — TSH: TSH: 0 u[IU]/mL — ABNORMAL LOW (ref 0.35–5.50)

## 2021-07-06 LAB — T4, FREE: Free T4: 1.12 ng/dL (ref 0.60–1.60)
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
## 2021-07-06 NOTE — Patient Instructions (Addendum)
Blood tests are requested for you today.  We'll let you know about the results.   If ever you have fever while taking methimazole, stop it and call us, even if the reason is obvious, because of the risk of a rare side-effect.  It is best to never miss the medication.  However, if you do miss it, next best is to double up the next time. Let's recheck the ultrasound.  you will receive a phone call, about a day and time for an appointment. Please come back for a follow-up appointment in 2-3 months.

## 2021-07-18 ENCOUNTER — Encounter: Payer: Self-pay | Admitting: Endocrinology

## 2021-09-07 ENCOUNTER — Ambulatory Visit: Payer: 59 | Admitting: Endocrinology

## 2021-09-12 ENCOUNTER — Ambulatory Visit: Payer: Medicaid Other | Admitting: Endocrinology

## 2021-10-10 ENCOUNTER — Ambulatory Visit (HOSPITAL_COMMUNITY): Payer: 59 | Attending: Endocrinology

## 2022-08-02 DIAGNOSIS — M4120 Other idiopathic scoliosis, site unspecified: Secondary | ICD-10-CM | POA: Diagnosis not present

## 2022-08-02 DIAGNOSIS — L4050 Arthropathic psoriasis, unspecified: Secondary | ICD-10-CM | POA: Diagnosis not present

## 2022-08-02 DIAGNOSIS — F330 Major depressive disorder, recurrent, mild: Secondary | ICD-10-CM | POA: Diagnosis not present

## 2022-08-02 DIAGNOSIS — E0500 Thyrotoxicosis with diffuse goiter without thyrotoxic crisis or storm: Secondary | ICD-10-CM | POA: Diagnosis not present

## 2022-08-02 DIAGNOSIS — F411 Generalized anxiety disorder: Secondary | ICD-10-CM | POA: Diagnosis not present

## 2022-08-02 DIAGNOSIS — E0590 Thyrotoxicosis, unspecified without thyrotoxic crisis or storm: Secondary | ICD-10-CM | POA: Diagnosis not present

## 2022-08-02 DIAGNOSIS — M797 Fibromyalgia: Secondary | ICD-10-CM | POA: Diagnosis not present

## 2022-08-02 DIAGNOSIS — F4310 Post-traumatic stress disorder, unspecified: Secondary | ICD-10-CM | POA: Diagnosis not present

## 2022-08-02 DIAGNOSIS — E049 Nontoxic goiter, unspecified: Secondary | ICD-10-CM | POA: Diagnosis not present

## 2022-08-02 DIAGNOSIS — M2550 Pain in unspecified joint: Secondary | ICD-10-CM | POA: Diagnosis not present

## 2022-08-24 DIAGNOSIS — R110 Nausea: Secondary | ICD-10-CM | POA: Diagnosis not present

## 2022-08-24 DIAGNOSIS — E0500 Thyrotoxicosis with diffuse goiter without thyrotoxic crisis or storm: Secondary | ICD-10-CM | POA: Diagnosis not present

## 2022-08-24 DIAGNOSIS — G894 Chronic pain syndrome: Secondary | ICD-10-CM | POA: Diagnosis not present

## 2022-08-24 DIAGNOSIS — K047 Periapical abscess without sinus: Secondary | ICD-10-CM | POA: Diagnosis not present

## 2022-09-06 DIAGNOSIS — E7800 Pure hypercholesterolemia, unspecified: Secondary | ICD-10-CM | POA: Diagnosis not present

## 2022-09-06 DIAGNOSIS — R011 Cardiac murmur, unspecified: Secondary | ICD-10-CM | POA: Diagnosis not present

## 2022-09-06 DIAGNOSIS — E0590 Thyrotoxicosis, unspecified without thyrotoxic crisis or storm: Secondary | ICD-10-CM | POA: Diagnosis not present

## 2022-09-06 DIAGNOSIS — F331 Major depressive disorder, recurrent, moderate: Secondary | ICD-10-CM | POA: Diagnosis not present

## 2022-09-06 DIAGNOSIS — F4310 Post-traumatic stress disorder, unspecified: Secondary | ICD-10-CM | POA: Diagnosis not present

## 2022-09-06 DIAGNOSIS — R000 Tachycardia, unspecified: Secondary | ICD-10-CM | POA: Diagnosis not present

## 2022-09-06 DIAGNOSIS — E559 Vitamin D deficiency, unspecified: Secondary | ICD-10-CM | POA: Diagnosis not present

## 2022-09-06 DIAGNOSIS — E0500 Thyrotoxicosis with diffuse goiter without thyrotoxic crisis or storm: Secondary | ICD-10-CM | POA: Diagnosis not present

## 2022-09-06 DIAGNOSIS — M4120 Other idiopathic scoliosis, site unspecified: Secondary | ICD-10-CM | POA: Diagnosis not present

## 2022-09-06 DIAGNOSIS — F419 Anxiety disorder, unspecified: Secondary | ICD-10-CM | POA: Diagnosis not present

## 2022-09-06 DIAGNOSIS — Z8249 Family history of ischemic heart disease and other diseases of the circulatory system: Secondary | ICD-10-CM | POA: Diagnosis not present

## 2022-09-06 DIAGNOSIS — L4050 Arthropathic psoriasis, unspecified: Secondary | ICD-10-CM | POA: Diagnosis not present

## 2022-10-04 DIAGNOSIS — E559 Vitamin D deficiency, unspecified: Secondary | ICD-10-CM | POA: Diagnosis not present

## 2022-10-04 DIAGNOSIS — E0500 Thyrotoxicosis with diffuse goiter without thyrotoxic crisis or storm: Secondary | ICD-10-CM | POA: Diagnosis not present

## 2022-10-05 DIAGNOSIS — Z1159 Encounter for screening for other viral diseases: Secondary | ICD-10-CM | POA: Diagnosis not present

## 2022-10-05 DIAGNOSIS — Z79899 Other long term (current) drug therapy: Secondary | ICD-10-CM | POA: Diagnosis not present

## 2022-10-05 DIAGNOSIS — E559 Vitamin D deficiency, unspecified: Secondary | ICD-10-CM | POA: Diagnosis not present

## 2022-10-05 DIAGNOSIS — Z131 Encounter for screening for diabetes mellitus: Secondary | ICD-10-CM | POA: Diagnosis not present

## 2022-10-05 DIAGNOSIS — M542 Cervicalgia: Secondary | ICD-10-CM | POA: Diagnosis not present

## 2022-10-05 DIAGNOSIS — M546 Pain in thoracic spine: Secondary | ICD-10-CM | POA: Diagnosis not present

## 2022-10-05 DIAGNOSIS — M129 Arthropathy, unspecified: Secondary | ICD-10-CM | POA: Diagnosis not present

## 2022-10-05 DIAGNOSIS — M545 Low back pain, unspecified: Secondary | ICD-10-CM | POA: Diagnosis not present

## 2022-10-26 DIAGNOSIS — G8929 Other chronic pain: Secondary | ICD-10-CM | POA: Diagnosis not present

## 2022-10-26 DIAGNOSIS — R030 Elevated blood-pressure reading, without diagnosis of hypertension: Secondary | ICD-10-CM | POA: Diagnosis not present

## 2022-10-26 DIAGNOSIS — M545 Low back pain, unspecified: Secondary | ICD-10-CM | POA: Diagnosis not present

## 2022-10-26 DIAGNOSIS — Z131 Encounter for screening for diabetes mellitus: Secondary | ICD-10-CM | POA: Diagnosis not present

## 2022-10-26 DIAGNOSIS — Z79899 Other long term (current) drug therapy: Secondary | ICD-10-CM | POA: Diagnosis not present

## 2022-11-01 DIAGNOSIS — M797 Fibromyalgia: Secondary | ICD-10-CM | POA: Diagnosis not present

## 2022-11-01 DIAGNOSIS — L4050 Arthropathic psoriasis, unspecified: Secondary | ICD-10-CM | POA: Diagnosis not present

## 2022-11-01 DIAGNOSIS — E0500 Thyrotoxicosis with diffuse goiter without thyrotoxic crisis or storm: Secondary | ICD-10-CM | POA: Diagnosis not present

## 2022-11-01 DIAGNOSIS — B850 Pediculosis due to Pediculus humanus capitis: Secondary | ICD-10-CM | POA: Diagnosis not present

## 2022-11-01 DIAGNOSIS — E559 Vitamin D deficiency, unspecified: Secondary | ICD-10-CM | POA: Diagnosis not present

## 2022-11-01 DIAGNOSIS — M4120 Other idiopathic scoliosis, site unspecified: Secondary | ICD-10-CM | POA: Diagnosis not present

## 2022-11-01 DIAGNOSIS — F411 Generalized anxiety disorder: Secondary | ICD-10-CM | POA: Diagnosis not present

## 2022-11-07 DIAGNOSIS — E0500 Thyrotoxicosis with diffuse goiter without thyrotoxic crisis or storm: Secondary | ICD-10-CM | POA: Diagnosis not present

## 2022-11-07 DIAGNOSIS — E0590 Thyrotoxicosis, unspecified without thyrotoxic crisis or storm: Secondary | ICD-10-CM | POA: Diagnosis not present

## 2022-11-26 DIAGNOSIS — Z131 Encounter for screening for diabetes mellitus: Secondary | ICD-10-CM | POA: Diagnosis not present

## 2022-11-26 DIAGNOSIS — R030 Elevated blood-pressure reading, without diagnosis of hypertension: Secondary | ICD-10-CM | POA: Diagnosis not present

## 2022-11-26 DIAGNOSIS — M545 Low back pain, unspecified: Secondary | ICD-10-CM | POA: Diagnosis not present

## 2022-11-26 DIAGNOSIS — Z79899 Other long term (current) drug therapy: Secondary | ICD-10-CM | POA: Diagnosis not present

## 2022-11-28 DIAGNOSIS — Z79899 Other long term (current) drug therapy: Secondary | ICD-10-CM | POA: Diagnosis not present

## 2022-12-04 DIAGNOSIS — R918 Other nonspecific abnormal finding of lung field: Secondary | ICD-10-CM | POA: Diagnosis not present

## 2022-12-04 DIAGNOSIS — R0602 Shortness of breath: Secondary | ICD-10-CM | POA: Diagnosis not present

## 2022-12-04 DIAGNOSIS — J9811 Atelectasis: Secondary | ICD-10-CM | POA: Diagnosis not present

## 2022-12-04 DIAGNOSIS — R002 Palpitations: Secondary | ICD-10-CM | POA: Diagnosis not present

## 2023-01-02 DIAGNOSIS — Z79899 Other long term (current) drug therapy: Secondary | ICD-10-CM | POA: Diagnosis not present

## 2023-01-02 DIAGNOSIS — I10 Essential (primary) hypertension: Secondary | ICD-10-CM | POA: Diagnosis not present

## 2023-01-02 DIAGNOSIS — M545 Low back pain, unspecified: Secondary | ICD-10-CM | POA: Diagnosis not present

## 2023-02-01 DIAGNOSIS — F32A Depression, unspecified: Secondary | ICD-10-CM | POA: Diagnosis not present

## 2023-02-01 DIAGNOSIS — M797 Fibromyalgia: Secondary | ICD-10-CM | POA: Diagnosis not present

## 2023-02-01 DIAGNOSIS — I10 Essential (primary) hypertension: Secondary | ICD-10-CM | POA: Diagnosis not present

## 2023-02-01 DIAGNOSIS — E0500 Thyrotoxicosis with diffuse goiter without thyrotoxic crisis or storm: Secondary | ICD-10-CM | POA: Diagnosis not present

## 2023-02-01 DIAGNOSIS — Z6822 Body mass index (BMI) 22.0-22.9, adult: Secondary | ICD-10-CM | POA: Diagnosis not present

## 2023-02-01 DIAGNOSIS — M545 Low back pain, unspecified: Secondary | ICD-10-CM | POA: Diagnosis not present

## 2023-02-01 DIAGNOSIS — Z79899 Other long term (current) drug therapy: Secondary | ICD-10-CM | POA: Diagnosis not present

## 2023-03-01 DIAGNOSIS — E039 Hypothyroidism, unspecified: Secondary | ICD-10-CM | POA: Diagnosis not present

## 2023-03-01 DIAGNOSIS — Z79899 Other long term (current) drug therapy: Secondary | ICD-10-CM | POA: Diagnosis not present

## 2023-03-01 DIAGNOSIS — I10 Essential (primary) hypertension: Secondary | ICD-10-CM | POA: Diagnosis not present

## 2023-03-01 DIAGNOSIS — E0500 Thyrotoxicosis with diffuse goiter without thyrotoxic crisis or storm: Secondary | ICD-10-CM | POA: Diagnosis not present

## 2023-03-01 DIAGNOSIS — M545 Low back pain, unspecified: Secondary | ICD-10-CM | POA: Diagnosis not present

## 2023-03-01 DIAGNOSIS — E559 Vitamin D deficiency, unspecified: Secondary | ICD-10-CM | POA: Diagnosis not present

## 2023-03-01 DIAGNOSIS — E7800 Pure hypercholesterolemia, unspecified: Secondary | ICD-10-CM | POA: Diagnosis not present

## 2023-03-01 DIAGNOSIS — Z6822 Body mass index (BMI) 22.0-22.9, adult: Secondary | ICD-10-CM | POA: Diagnosis not present

## 2023-03-05 DIAGNOSIS — Z79899 Other long term (current) drug therapy: Secondary | ICD-10-CM | POA: Diagnosis not present

## 2023-03-27 DIAGNOSIS — M797 Fibromyalgia: Secondary | ICD-10-CM | POA: Diagnosis not present

## 2023-03-27 DIAGNOSIS — N814 Uterovaginal prolapse, unspecified: Secondary | ICD-10-CM | POA: Diagnosis not present

## 2023-03-27 DIAGNOSIS — E0500 Thyrotoxicosis with diffuse goiter without thyrotoxic crisis or storm: Secondary | ICD-10-CM | POA: Diagnosis not present

## 2023-03-27 DIAGNOSIS — Z124 Encounter for screening for malignant neoplasm of cervix: Secondary | ICD-10-CM | POA: Diagnosis not present

## 2023-03-27 DIAGNOSIS — F4310 Post-traumatic stress disorder, unspecified: Secondary | ICD-10-CM | POA: Diagnosis not present

## 2023-03-27 DIAGNOSIS — L4050 Arthropathic psoriasis, unspecified: Secondary | ICD-10-CM | POA: Diagnosis not present

## 2023-03-27 DIAGNOSIS — F410 Panic disorder [episodic paroxysmal anxiety] without agoraphobia: Secondary | ICD-10-CM | POA: Diagnosis not present

## 2023-03-27 DIAGNOSIS — E559 Vitamin D deficiency, unspecified: Secondary | ICD-10-CM | POA: Diagnosis not present

## 2023-03-27 DIAGNOSIS — F411 Generalized anxiety disorder: Secondary | ICD-10-CM | POA: Diagnosis not present

## 2023-03-27 DIAGNOSIS — M4120 Other idiopathic scoliosis, site unspecified: Secondary | ICD-10-CM | POA: Diagnosis not present

## 2023-03-27 DIAGNOSIS — Z113 Encounter for screening for infections with a predominantly sexual mode of transmission: Secondary | ICD-10-CM | POA: Diagnosis not present

## 2023-03-27 DIAGNOSIS — E0590 Thyrotoxicosis, unspecified without thyrotoxic crisis or storm: Secondary | ICD-10-CM | POA: Diagnosis not present

## 2023-03-27 DIAGNOSIS — E876 Hypokalemia: Secondary | ICD-10-CM | POA: Diagnosis not present

## 2023-03-29 DIAGNOSIS — R030 Elevated blood-pressure reading, without diagnosis of hypertension: Secondary | ICD-10-CM | POA: Diagnosis not present

## 2023-03-29 DIAGNOSIS — R0989 Other specified symptoms and signs involving the circulatory and respiratory systems: Secondary | ICD-10-CM | POA: Diagnosis not present

## 2023-03-29 DIAGNOSIS — Z79899 Other long term (current) drug therapy: Secondary | ICD-10-CM | POA: Diagnosis not present

## 2023-03-29 DIAGNOSIS — Z6841 Body Mass Index (BMI) 40.0 and over, adult: Secondary | ICD-10-CM | POA: Diagnosis not present

## 2023-03-29 DIAGNOSIS — M545 Low back pain, unspecified: Secondary | ICD-10-CM | POA: Diagnosis not present

## 2023-03-29 DIAGNOSIS — E0500 Thyrotoxicosis with diffuse goiter without thyrotoxic crisis or storm: Secondary | ICD-10-CM | POA: Diagnosis not present

## 2023-04-02 DIAGNOSIS — Z79899 Other long term (current) drug therapy: Secondary | ICD-10-CM | POA: Diagnosis not present

## 2023-04-12 DIAGNOSIS — U071 COVID-19: Secondary | ICD-10-CM | POA: Diagnosis not present

## 2023-05-01 DIAGNOSIS — M797 Fibromyalgia: Secondary | ICD-10-CM | POA: Diagnosis not present

## 2023-05-01 DIAGNOSIS — R5383 Other fatigue: Secondary | ICD-10-CM | POA: Diagnosis not present

## 2023-05-01 DIAGNOSIS — L4050 Arthropathic psoriasis, unspecified: Secondary | ICD-10-CM | POA: Diagnosis not present

## 2023-05-01 DIAGNOSIS — E0590 Thyrotoxicosis, unspecified without thyrotoxic crisis or storm: Secondary | ICD-10-CM | POA: Diagnosis not present

## 2023-05-01 DIAGNOSIS — E0500 Thyrotoxicosis with diffuse goiter without thyrotoxic crisis or storm: Secondary | ICD-10-CM | POA: Diagnosis not present

## 2023-05-01 DIAGNOSIS — F430 Acute stress reaction: Secondary | ICD-10-CM | POA: Diagnosis not present

## 2023-05-01 DIAGNOSIS — E559 Vitamin D deficiency, unspecified: Secondary | ICD-10-CM | POA: Diagnosis not present

## 2023-05-01 DIAGNOSIS — E049 Nontoxic goiter, unspecified: Secondary | ICD-10-CM | POA: Diagnosis not present

## 2023-05-01 DIAGNOSIS — F411 Generalized anxiety disorder: Secondary | ICD-10-CM | POA: Diagnosis not present

## 2023-05-03 DIAGNOSIS — M2550 Pain in unspecified joint: Secondary | ICD-10-CM | POA: Diagnosis not present

## 2023-05-03 DIAGNOSIS — L409 Psoriasis, unspecified: Secondary | ICD-10-CM | POA: Diagnosis not present
